# Patient Record
Sex: Female | Born: 1970 | Hispanic: No | Marital: Married | Smoking: Former smoker
Health system: Southern US, Community
[De-identification: ages and names within clinical notes are randomized; demographics above are authoritative.]

## PROBLEM LIST (undated history)

## (undated) DIAGNOSIS — N200 Calculus of kidney: Secondary | ICD-10-CM

## (undated) DIAGNOSIS — R002 Palpitations: Secondary | ICD-10-CM

## (undated) DIAGNOSIS — Z87442 Personal history of urinary calculi: Secondary | ICD-10-CM

## (undated) DIAGNOSIS — I471 Supraventricular tachycardia, unspecified: Secondary | ICD-10-CM

## (undated) DIAGNOSIS — I499 Cardiac arrhythmia, unspecified: Secondary | ICD-10-CM

---
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
## 2008-03-24 ENCOUNTER — Other Ambulatory Visit: Admission: RE | Admit: 2008-03-24 | Discharge: 2008-03-24 | Payer: Self-pay | Admitting: Internal Medicine

## 2008-11-13 ENCOUNTER — Ambulatory Visit: Payer: Self-pay | Admitting: Internal Medicine

## 2009-06-01 ENCOUNTER — Ambulatory Visit: Payer: Self-pay | Admitting: Internal Medicine

## 2010-01-10 ENCOUNTER — Ambulatory Visit: Payer: Self-pay | Admitting: Internal Medicine

## 2010-01-10 ENCOUNTER — Encounter: Admission: RE | Admit: 2010-01-10 | Discharge: 2010-01-10 | Payer: Self-pay | Admitting: Internal Medicine

## 2011-01-12 ENCOUNTER — Ambulatory Visit (HOSPITAL_COMMUNITY)
Admission: RE | Admit: 2011-01-12 | Discharge: 2011-01-12 | Disposition: A | Discharge status: Home / Self Care | Payer: BC Managed Care – PPO | Source: Ambulatory Visit | Attending: Urology | Admitting: Urology

## 2011-01-12 DIAGNOSIS — Z79899 Other long term (current) drug therapy: Secondary | ICD-10-CM | POA: Insufficient documentation

## 2011-01-12 DIAGNOSIS — F17200 Nicotine dependence, unspecified, uncomplicated: Secondary | ICD-10-CM | POA: Insufficient documentation

## 2011-01-12 DIAGNOSIS — R109 Unspecified abdominal pain: Secondary | ICD-10-CM | POA: Insufficient documentation

## 2011-01-12 DIAGNOSIS — N200 Calculus of kidney: Secondary | ICD-10-CM | POA: Insufficient documentation

## 2011-07-31 ENCOUNTER — Ambulatory Visit (HOSPITAL_COMMUNITY): Admission: RE | Admit: 2011-07-31 | Payer: BC Managed Care – PPO | Source: Ambulatory Visit | Admitting: Urology

## 2011-07-31 ENCOUNTER — Encounter (HOSPITAL_COMMUNITY): Admission: RE | Payer: Self-pay | Source: Ambulatory Visit

## 2011-07-31 SURGERY — LITHOTRIPSY, ESWL
Anesthesia: LOCAL | Laterality: Left

## 2013-05-05 ENCOUNTER — Other Ambulatory Visit: Payer: Self-pay | Admitting: Urology

## 2013-05-06 ENCOUNTER — Other Ambulatory Visit: Payer: Self-pay | Admitting: Radiology

## 2013-05-06 ENCOUNTER — Other Ambulatory Visit: Payer: Self-pay | Admitting: Urology

## 2013-05-06 DIAGNOSIS — N200 Calculus of kidney: Secondary | ICD-10-CM

## 2013-06-06 ENCOUNTER — Encounter (HOSPITAL_COMMUNITY): Payer: Self-pay | Admitting: Pharmacy Technician

## 2013-06-10 ENCOUNTER — Encounter (HOSPITAL_COMMUNITY): Payer: Self-pay | Admitting: Pharmacy Technician

## 2013-06-12 ENCOUNTER — Other Ambulatory Visit (HOSPITAL_COMMUNITY): Payer: Self-pay | Admitting: Urology

## 2013-06-12 ENCOUNTER — Other Ambulatory Visit: Payer: Self-pay | Admitting: Radiology

## 2013-06-13 ENCOUNTER — Encounter (HOSPITAL_COMMUNITY)
Admission: RE | Admit: 2013-06-13 | Discharge: 2013-06-13 | Disposition: A | Discharge status: Home / Self Care | Payer: BC Managed Care – PPO | Source: Ambulatory Visit | Attending: Urology | Admitting: Urology

## 2013-06-13 ENCOUNTER — Encounter (HOSPITAL_COMMUNITY): Payer: Self-pay

## 2013-06-13 ENCOUNTER — Other Ambulatory Visit: Payer: Self-pay

## 2013-06-13 DIAGNOSIS — Z01812 Encounter for preprocedural laboratory examination: Secondary | ICD-10-CM | POA: Insufficient documentation

## 2013-06-13 DIAGNOSIS — Z01818 Encounter for other preprocedural examination: Secondary | ICD-10-CM | POA: Insufficient documentation

## 2013-06-13 DIAGNOSIS — Z01810 Encounter for preprocedural cardiovascular examination: Secondary | ICD-10-CM | POA: Insufficient documentation

## 2013-06-13 HISTORY — DX: Palpitations: R00.2

## 2013-06-13 HISTORY — DX: Personal history of urinary calculi: Z87.442

## 2013-06-13 HISTORY — DX: Supraventricular tachycardia, unspecified: I47.10

## 2013-06-13 HISTORY — DX: Supraventricular tachycardia: I47.1

## 2013-06-13 LAB — CBC
Hemoglobin: 14.6 g/dL (ref 12.0–15.0)
MCHC: 34.2 g/dL (ref 30.0–36.0)
Platelets: 255 10*3/uL (ref 150–400)
RDW: 12.8 % (ref 11.5–15.5)
WBC: 8.3 10*3/uL (ref 4.0–10.5)

## 2013-06-13 LAB — APTT: aPTT: 27 seconds (ref 24–37)

## 2013-06-13 LAB — BASIC METABOLIC PANEL
GFR calc Af Amer: >90 mL/min (ref >90)
GFR calc non Af Amer: >90 mL/min (ref >90)
Glucose, Bld: 117 mg/dL — ABNORMAL HIGH (ref 70–99)
Potassium: 3.9 mEq/L (ref 3.5–5.1)
Sodium: 137 mEq/L (ref 135–145)

## 2013-06-13 LAB — PROTIME-INR
INR: 0.99 (ref 0.00–1.49)
Prothrombin Time: 12.9 seconds (ref 11.6–15.2)

## 2013-06-13 LAB — ABO/RH: ABO/RH(D): A POS

## 2013-06-13 LAB — HCG, SERUM, QUALITATIVE: Preg, Serum: NEGATIVE

## 2013-06-13 NOTE — Patient Instructions (Addendum)
JEANINE CAVEN  06/13/2013                           YOUR PROCEDURE IS SCHEDULED ON: 06/17/13               PLEASE REPORT TO SHORT STAY CENTER AT :  8:30 AM               CALL THIS NUMBER IF ANY PROBLEMS THE DAY OF SURGERY :               [phone]                      REMEMBER:   Do not eat food or drink liquids AFTER MIDNIGHT   Take these medicines the morning of surgery with A SIP OF WATER:  NONE   Do not wear jewelry, make-up   Do not wear lotions, powders, or perfumes.   Do not shave legs or underarms 12 hrs. before surgery (men may shave face)  Do not bring valuables to the hospital.  Contacts, dentures or bridgework may not be worn into surgery.  Leave suitcase in the car. After surgery it may be brought to your room.  For patients admitted to the hospital more than one night, checkout time is 11:00                          The day of discharge.   Patients discharged the day of surgery will not be allowed to drive home                             If going home same day of surgery, must have someone stay with you first                           24 hrs at home and arrange for some one to drive you home from hospital.    Special Instructions:   Please read over the following fact sheets that you were given:                              1. [facility] PREPARING FOR SURGERY SHEET                                                X_____________________________________________________________________        Failure to follow these instructions may result in cancellation of your surgery

## 2013-06-16 NOTE — H&P (Signed)
ctive Problems Problems  1. Microscopic Hematuria 599.72 2. 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 Pyuria 791.9 4. Renal Cyst Right 593.2  History of Present Illness   Cheyenne Riley returns today in f/u for her bilateral nephrolithiasis.  She continues to have some left flank pain that is positional.  She has had no associated hematuria, nausea of voiding complaints.   The pain can radiate to the pelvic area when is it most severe.  A KUB shows a LLP stone that is now 16.3x9.4mm and a collection of stones in the right lower pole- all stones appear stable from her previous KUB in July 2013.  She denies dysuria or gross hematuria.   Past Medical History Problems  1. History of  Migraine Headache 346.90 2. History of  Nephrolithiasis V13.01 3. History of  Ureteral Stone 592.1  Surgical History Problems  1. History of  Hysteroscopy With Endometrial Ablation 2. History of  Lithotripsy 3. History of  Tonsillectomy With Adenoidectomy  Current Meds 1. Ibuprofen TABS; Therapy: (Recorded:22Mar2011) to 2. Oxycodone-Acetaminophen 5-325 MG Oral Tablet; take 1 or 2 tablets q 4-6 hours prn pain;  Therapy: 18Jul2013 to (Last Rx:18Jul2013)  Allergies Medication  1. No Known Drug Allergies  Family History Problems  1. Maternal history of  Atherosclerosis V17.49 2. Maternal history of  Family Health Status - Father's Age age 63 72. Maternal history of  Family Health Status - Mother's Age age 48 4. Maternal history of  Family Health Status Number Of Children 2 sons 2 daughters 5. Maternal history of  Heart Disease V17.49 6. Paternal history of  Nephrolithiasis  Social History Problems  1. Alcohol Use social 2. Caffeine Use 3. Marital History - Currently Married 4. Occupation: Event organiser 5. Tobacco Use V15.82 currently patching--smoked for 15 years  Quit 6 weeks ago   Past and social history reviewed and updated.   Review of Systems Genitourinary, constitutional, skin, eye,  otolaryngeal, hematologic/lymphatic, cardiovascular, pulmonary, endocrine, musculoskeletal, gastrointestinal, neurological and psychiatric system(s) were reviewed and pertinent findings if present are noted.  Gastrointestinal: flank pain and abdominal pain.  Constitutional: recent 15 lb weight gain.    Vitals Vital Signs [Data Includes: Last 1 Day]  05Sep2014 09:19AM  BMI Calculated: 35.88 BSA Calculated: 1.92 Weight: 200 lb  Blood Pressure: 125 / 83 Heart Rate: 65  Physical Exam Constitutional: Well nourished and well developed . No acute distress.  Pulmonary: No respiratory distress and normal respiratory rhythm and effort.  Cardiovascular: Heart rate and rhythm are normal . No peripheral edema.    Results/Data Urine [Data Includes: Last 1 Day]   05Sep2014  COLOR YELLOW   APPEARANCE CLEAR   SPECIFIC GRAVITY 1.010   pH 6.5   GLUCOSE NEG mg/dL  BILIRUBIN NEG   KETONE NEG mg/dL  BLOOD LARGE   PROTEIN NEG mg/dL  UROBILINOGEN 0.2 mg/dL  NITRITE NEG   LEUKOCYTE ESTERASE SMALL   SQUAMOUS EPITHELIAL/HPF RARE   WBC 7-10 WBC/hpf  RBC 3-6 RBC/hpf  BACTERIA RARE   CRYSTALS NONE SEEN   CASTS NONE SEEN    The following images/tracing/specimen were independently visualized:  KUB today shows stable small stones in the RLP but the LLP stone has grown from 16 to 22mm and is more central. There are no other abnormalities.  The following clinical lab reports were reviewed:  UA reviewed.    Assessment Assessed  1. 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 Pyuria 791.9 3. Microscopic Hematuria 599.72   Her stone has grown and still has pain. Her  UA has pyuria and hematuria that is probably from the stones.   Plan  Health Maintenance (V70.0)  1. UA With REFLEX  Done: 05Sep2014 08:55AM Nephrolithiasis Of Both Kidneys (592.0)  2. Oxycodone-Acetaminophen 5-325 MG Oral Tablet; take 1 or 2 tablets q 4-6 hours prn pain;  Therapy: 18Jul2013 to (Last Rx:05Sep2014) 3. Potassium Citrate ER  10 MEQ (1080 MG) Oral Tablet Extended Release; TAKE 1 TABLET TID;  Therapy: 05Sep2014 to (Last Rx:05Sep2014) 4. Follow-up Schedule Surgery Office  Follow-up  Done: 05Sep2014   I will culture the urine today. Potassium citrate and oxycodone refilled. I discussed treatment of her metabolically active stone which include ESWL or PCNL.   With her stone size she would likely require multiple ESWL's and stenting and I think PCNL is the best option.   I revgiewed the risks of bleeding, infection, renal or adjacent organ injury, need for multiple procedures, thrombotic events, urine leak and anesthetic risks.  We will get her set up in the near future

## 2013-06-17 ENCOUNTER — Ambulatory Visit (HOSPITAL_COMMUNITY): Payer: BC Managed Care – PPO

## 2013-06-17 ENCOUNTER — Ambulatory Visit (HOSPITAL_COMMUNITY)
Admission: RE | Admit: 2013-06-17 | Discharge: 2013-06-17 | Disposition: A | Discharge status: Home / Self Care | Payer: BC Managed Care – PPO | Source: Ambulatory Visit | Attending: Urology | Admitting: Urology

## 2013-06-17 ENCOUNTER — Encounter (HOSPITAL_COMMUNITY)
Admission: RE | Disposition: A | Discharge status: Home / Self Care | Payer: Self-pay | Source: Ambulatory Visit | Attending: Urology

## 2013-06-17 ENCOUNTER — Encounter (HOSPITAL_COMMUNITY): Payer: Self-pay | Admitting: Radiology

## 2013-06-17 ENCOUNTER — Observation Stay (HOSPITAL_COMMUNITY)
Admission: RE | Admit: 2013-06-17 | Discharge: 2013-06-18 | Disposition: A | Discharge status: Home / Self Care | Payer: BC Managed Care – PPO | Source: Ambulatory Visit | Attending: Urology | Admitting: Urology

## 2013-06-17 ENCOUNTER — Encounter (HOSPITAL_COMMUNITY): Payer: BC Managed Care – PPO | Admitting: Anesthesiology

## 2013-06-17 ENCOUNTER — Encounter (HOSPITAL_COMMUNITY): Payer: Self-pay | Admitting: Urology

## 2013-06-17 ENCOUNTER — Ambulatory Visit (HOSPITAL_COMMUNITY): Payer: BC Managed Care – PPO | Admitting: Anesthesiology

## 2013-06-17 ENCOUNTER — Encounter (HOSPITAL_COMMUNITY): Payer: Self-pay

## 2013-06-17 VITALS — BP 139/79 | HR 75 | Temp 98.3°F | Resp 10 | Ht 62.75 in | Wt 204.0 lb

## 2013-06-17 DIAGNOSIS — N281 Cyst of kidney, acquired: Secondary | ICD-10-CM | POA: Insufficient documentation

## 2013-06-17 DIAGNOSIS — R82998 Other abnormal findings in urine: Secondary | ICD-10-CM | POA: Insufficient documentation

## 2013-06-17 DIAGNOSIS — N200 Calculus of kidney: Secondary | ICD-10-CM

## 2013-06-17 DIAGNOSIS — R3129 Other microscopic hematuria: Secondary | ICD-10-CM | POA: Insufficient documentation

## 2013-06-17 DIAGNOSIS — Z87891 Personal history of nicotine dependence: Secondary | ICD-10-CM | POA: Insufficient documentation

## 2013-06-17 HISTORY — DX: Calculus of kidney: N20.0

## 2013-06-17 HISTORY — PX: NEPHROLITHOTOMY: SHX5134

## 2013-06-17 LAB — TYPE AND SCREEN
ABO/RH(D): A POS
Antibody Screen: NEGATIVE

## 2013-06-17 SURGERY — NEPHROLITHOTOMY PERCUTANEOUS
Anesthesia: General | Site: Back | Laterality: Left | Wound class: Clean Contaminated

## 2013-06-17 MED ORDER — HYDROMORPHONE HCL PF 1 MG/ML IJ SOLN
0.5000 mg | INTRAMUSCULAR | Status: DC | PRN
  Administered 2013-06-17 – 2013-06-18 (×3): 1 mg via INTRAVENOUS
  Filled 2013-06-17 (×3): qty 1

## 2013-06-17 MED ORDER — CIPROFLOXACIN IN D5W 400 MG/200ML IV SOLN
400.0000 mg | INTRAVENOUS | Status: AC
  Administered 2013-06-17: 400 mg via INTRAVENOUS

## 2013-06-17 MED ORDER — DIPHENHYDRAMINE HCL 50 MG/ML IJ SOLN: 12.5000 mg | Freq: Four times a day (QID) | INTRAMUSCULAR | Status: DC | PRN

## 2013-06-17 MED ORDER — HYDROMORPHONE HCL PF 1 MG/ML IJ SOLN
0.2500 mg | INTRAMUSCULAR | Status: DC | PRN
  Administered 2013-06-17 (×2): 0.5 mg via INTRAVENOUS

## 2013-06-17 MED ORDER — IOHEXOL 300 MG/ML  SOLN
INTRAMUSCULAR | Status: DC | PRN
  Administered 2013-06-17: 15 mL via INTRAVENOUS

## 2013-06-17 MED ORDER — HYDROMORPHONE HCL PF 1 MG/ML IJ SOLN
INTRAMUSCULAR | Status: DC | PRN
  Administered 2013-06-17 (×2): 0.5 mg via INTRAVENOUS
  Administered 2013-06-17 (×2): .5 mg via INTRAVENOUS

## 2013-06-17 MED ORDER — ONDANSETRON HCL 4 MG/2ML IJ SOLN
INTRAMUSCULAR | Status: DC | PRN
  Administered 2013-06-17: 4 mg via INTRAVENOUS

## 2013-06-17 MED ORDER — LIDOCAINE HCL (CARDIAC) 20 MG/ML IV SOLN
INTRAVENOUS | Status: DC | PRN
  Administered 2013-06-17: 100 mg via INTRAVENOUS

## 2013-06-17 MED ORDER — FENTANYL CITRATE 0.05 MG/ML IJ SOLN
INTRAMUSCULAR | Status: AC
  Filled 2013-06-17: qty 6

## 2013-06-17 MED ORDER — IOHEXOL 300 MG/ML  SOLN
25.0000 mL | Freq: Once | INTRAMUSCULAR | Status: AC | PRN
  Administered 2013-06-17: 1 mL

## 2013-06-17 MED ORDER — FENTANYL CITRATE 0.05 MG/ML IJ SOLN
INTRAMUSCULAR | Status: AC
  Filled 2013-06-17: qty 2

## 2013-06-17 MED ORDER — MIDAZOLAM HCL 2 MG/2ML IJ SOLN
INTRAMUSCULAR | Status: AC | PRN
  Administered 2013-06-17 (×4): 1 mg via INTRAVENOUS

## 2013-06-17 MED ORDER — LACTATED RINGERS IV SOLN
INTRAVENOUS | Status: DC
  Administered 2013-06-17: 14:00:00 via INTRAVENOUS

## 2013-06-17 MED ORDER — DEXAMETHASONE SODIUM PHOSPHATE 10 MG/ML IJ SOLN
INTRAMUSCULAR | Status: DC | PRN
  Administered 2013-06-17: 10 mg via INTRAVENOUS

## 2013-06-17 MED ORDER — SODIUM CHLORIDE 0.9 % IR SOLN
Status: DC | PRN
  Administered 2013-06-17: 6000 mL via INTRAVESICAL

## 2013-06-17 MED ORDER — MIDAZOLAM HCL 5 MG/5ML IJ SOLN
INTRAMUSCULAR | Status: DC | PRN
  Administered 2013-06-17: 2 mg via INTRAVENOUS

## 2013-06-17 MED ORDER — FENTANYL CITRATE 0.05 MG/ML IJ SOLN
25.0000 ug | INTRAMUSCULAR | Status: DC | PRN
  Administered 2013-06-17 (×3): 50 ug via INTRAVENOUS

## 2013-06-17 MED ORDER — LIDOCAINE-EPINEPHRINE (PF) 2 %-1:200000 IJ SOLN
INTRAMUSCULAR | Status: AC
  Filled 2013-06-17: qty 20

## 2013-06-17 MED ORDER — BISACODYL 10 MG RE SUPP: 10.0000 mg | Freq: Every day | RECTAL | Status: DC | PRN

## 2013-06-17 MED ORDER — NEOSTIGMINE METHYLSULFATE 1 MG/ML IJ SOLN
INTRAMUSCULAR | Status: DC | PRN
  Administered 2013-06-17: 4 mg via INTRAVENOUS

## 2013-06-17 MED ORDER — FENTANYL CITRATE 0.05 MG/ML IJ SOLN
INTRAMUSCULAR | Status: DC | PRN
  Administered 2013-06-17: 100 ug via INTRAVENOUS
  Administered 2013-06-17 (×3): 50 ug via INTRAVENOUS

## 2013-06-17 MED ORDER — KCL IN DEXTROSE-NACL 20-5-0.45 MEQ/L-%-% IV SOLN
INTRAVENOUS | Status: DC
  Administered 2013-06-17 – 2013-06-18 (×4): via INTRAVENOUS
  Filled 2013-06-17 (×4): qty 1000

## 2013-06-17 MED ORDER — CIPROFLOXACIN IN D5W 400 MG/200ML IV SOLN
INTRAVENOUS | Status: AC
  Filled 2013-06-17: qty 200

## 2013-06-17 MED ORDER — ACETAMINOPHEN 325 MG PO TABS: 650.0000 mg | ORAL_TABLET | ORAL | Status: DC | PRN

## 2013-06-17 MED ORDER — CIPROFLOXACIN HCL 500 MG PO TABS
500.0000 mg | ORAL_TABLET | Freq: Two times a day (BID) | ORAL | Status: DC
  Administered 2013-06-17 – 2013-06-18 (×2): 500 mg via ORAL
  Filled 2013-06-17 (×4): qty 1

## 2013-06-17 MED ORDER — CIPROFLOXACIN IN D5W 400 MG/200ML IV SOLN: 400.0000 mg | INTRAVENOUS | Status: DC

## 2013-06-17 MED ORDER — OXYCODONE-ACETAMINOPHEN 5-325 MG PO TABS: 1.0000 | ORAL_TABLET | ORAL | Status: DC | PRN

## 2013-06-17 MED ORDER — PROPOFOL 10 MG/ML IV BOLUS
INTRAVENOUS | Status: DC | PRN
  Administered 2013-06-17: 180 mg via INTRAVENOUS

## 2013-06-17 MED ORDER — ROCURONIUM BROMIDE 100 MG/10ML IV SOLN
INTRAVENOUS | Status: DC | PRN
  Administered 2013-06-17: 40 mg via INTRAVENOUS

## 2013-06-17 MED ORDER — MIDAZOLAM HCL 2 MG/2ML IJ SOLN
INTRAMUSCULAR | Status: AC
  Filled 2013-06-17: qty 6

## 2013-06-17 MED ORDER — SODIUM CHLORIDE 0.9 % IV SOLN
INTRAVENOUS | Status: DC
  Administered 2013-06-17: 08:00:00 via INTRAVENOUS

## 2013-06-17 MED ORDER — DIPHENHYDRAMINE HCL 12.5 MG/5ML PO ELIX: 12.5000 mg | ORAL_SOLUTION | Freq: Four times a day (QID) | ORAL | Status: DC | PRN

## 2013-06-17 MED ORDER — LACTATED RINGERS IV SOLN
INTRAVENOUS | Status: DC | PRN
  Administered 2013-06-17: 12:00:00 via INTRAVENOUS

## 2013-06-17 MED ORDER — ONDANSETRON HCL 4 MG/2ML IJ SOLN
4.0000 mg | INTRAMUSCULAR | Status: DC | PRN
  Administered 2013-06-17 – 2013-06-18 (×2): 4 mg via INTRAVENOUS
  Filled 2013-06-17 (×2): qty 2

## 2013-06-17 MED ORDER — PROMETHAZINE HCL 25 MG/ML IJ SOLN: 6.2500 mg | INTRAMUSCULAR | Status: DC | PRN

## 2013-06-17 MED ORDER — ZOLPIDEM TARTRATE 5 MG PO TABS: 5.0000 mg | ORAL_TABLET | Freq: Every evening | ORAL | Status: DC | PRN

## 2013-06-17 MED ORDER — FENTANYL CITRATE 0.05 MG/ML IJ SOLN
INTRAMUSCULAR | Status: AC | PRN
  Administered 2013-06-17: 100 ug via INTRAVENOUS
  Administered 2013-06-17 (×2): 50 ug via INTRAVENOUS

## 2013-06-17 MED ORDER — GLYCOPYRROLATE 0.2 MG/ML IJ SOLN
INTRAMUSCULAR | Status: DC | PRN
  Administered 2013-06-17: 0.6 mg via INTRAVENOUS

## 2013-06-17 MED ORDER — HYOSCYAMINE SULFATE 0.125 MG SL SUBL
0.1250 mg | SUBLINGUAL_TABLET | SUBLINGUAL | Status: DC | PRN
  Filled 2013-06-17: qty 1

## 2013-06-17 MED ORDER — CIPROFLOXACIN HCL 500 MG PO TABS: 500.0000 mg | ORAL_TABLET | Freq: Two times a day (BID) | ORAL | Status: DC

## 2013-06-17 MED ORDER — HYDROMORPHONE HCL PF 1 MG/ML IJ SOLN
INTRAMUSCULAR | Status: AC
  Filled 2013-06-17: qty 1

## 2013-06-17 MED ORDER — OXYCODONE HCL 5 MG PO TABS
5.0000 mg | ORAL_TABLET | ORAL | Status: DC | PRN
  Administered 2013-06-17 – 2013-06-18 (×4): 5 mg via ORAL
  Filled 2013-06-17 (×4): qty 1

## 2013-06-17 MED ORDER — DOCUSATE SODIUM 100 MG PO CAPS
100.0000 mg | ORAL_CAPSULE | Freq: Two times a day (BID) | ORAL | Status: DC
  Administered 2013-06-17 – 2013-06-18 (×2): 100 mg via ORAL
  Filled 2013-06-17 (×3): qty 1

## 2013-06-17 SURGICAL SUPPLY — 48 items
BAG URINE DRAINAGE (UROLOGICAL SUPPLIES) ×3 IMPLANT
BAG URO CATCHER STRL LF (DRAPE) IMPLANT
BASKET ZERO TIP NITINOL 2.4FR (BASKET) ×3 IMPLANT
BENZOIN TINCTURE PRP APPL 2/3 (GAUZE/BANDAGES/DRESSINGS) ×3 IMPLANT
BLADE SURG 15 STRL LF DISP TIS (BLADE) ×2 IMPLANT
BLADE SURG 15 STRL SS (BLADE) ×1
CARTRIDGE STONEBREAK CO2 KIDNE (ELECTROSURGICAL) ×3 IMPLANT
CATH AINSWORTH 30CC 24FR (CATHETERS) ×3 IMPLANT
CATH BEACON 5.038 65CM KMP-01 (CATHETERS) ×3 IMPLANT
CATH FOLEY 2W COUNCIL 20FR 5CC (CATHETERS) IMPLANT
CATH ROBINSON RED A/P 20FR (CATHETERS) IMPLANT
CATH URET 5FR 28IN OPEN ENDED (CATHETERS) IMPLANT
CATH X-FORCE N30 NEPHROSTOMY (TUBING) ×3 IMPLANT
CLOTH BEACON ORANGE TIMEOUT ST (SAFETY) ×3 IMPLANT
COVER SURGICAL LIGHT HANDLE (MISCELLANEOUS) ×3 IMPLANT
DRAPE C-ARM 42X120 X-RAY (DRAPES) ×3 IMPLANT
DRAPE CAMERA CLOSED 9X96 (DRAPES) ×3 IMPLANT
DRAPE LINGEMAN PERC (DRAPES) ×3 IMPLANT
DRAPE SURG IRRIG POUCH 19X23 (DRAPES) ×3 IMPLANT
DRSG PAD ABDOMINAL 8X10 ST (GAUZE/BANDAGES/DRESSINGS) ×3 IMPLANT
DRSG TEGADERM 8X12 (GAUZE/BANDAGES/DRESSINGS) ×3 IMPLANT
FIBER LASER FLEXIVA 550 (UROLOGICAL SUPPLIES) IMPLANT
GLOVE SURG SS PI 8.0 STRL IVOR (GLOVE) IMPLANT
GOWN STRL REIN XL XLG (GOWN DISPOSABLE) ×3 IMPLANT
GUIDEWIRE AMPLAZ .035X145 (WIRE) ×6 IMPLANT
KIT BASIN OR (CUSTOM PROCEDURE TRAY) ×3 IMPLANT
LASER FIBER DISP 1000U (UROLOGICAL SUPPLIES) IMPLANT
MANIFOLD NEPTUNE II (INSTRUMENTS) ×3 IMPLANT
NS IRRIG 1000ML POUR BTL (IV SOLUTION) ×3 IMPLANT
PACK BASIC VI WITH GOWN DISP (CUSTOM PROCEDURE TRAY) ×3 IMPLANT
PACK CYSTO (CUSTOM PROCEDURE TRAY) ×3 IMPLANT
PAD ABD 7.5X8 STRL (GAUZE/BANDAGES/DRESSINGS) ×6 IMPLANT
PROBE KIDNEY STONEBRKR 2.0X425 (ELECTROSURGICAL) ×3 IMPLANT
PROBE LITHOCLAST ULTRA 3.8X403 (UROLOGICAL SUPPLIES) IMPLANT
PROBE PNEUMATIC 1.0MMX570MM (UROLOGICAL SUPPLIES) IMPLANT
SET IRRIG Y TYPE TUR BLADDER L (SET/KITS/TRAYS/PACK) ×3 IMPLANT
SET WARMING FLUID IRRIGATION (MISCELLANEOUS) IMPLANT
SHEATH PEELAWAY SET 9 (SHEATH) ×3 IMPLANT
SPONGE GAUZE 4X4 12PLY (GAUZE/BANDAGES/DRESSINGS) ×3 IMPLANT
SPONGE LAP 4X18 X RAY DECT (DISPOSABLE) ×3 IMPLANT
STONE CATCHER W/TUBE ADAPTER (UROLOGICAL SUPPLIES) ×3 IMPLANT
SUT SILK 2 0 30  PSL (SUTURE) ×1
SUT SILK 2 0 30 PSL (SUTURE) ×2 IMPLANT
SYR 20CC LL (SYRINGE) ×6 IMPLANT
SYRINGE 10CC LL (SYRINGE) ×3 IMPLANT
TOWEL OR NON WOVEN STRL DISP B (DISPOSABLE) ×3 IMPLANT
TRAY FOLEY CATH 14FRSI W/METER (CATHETERS) ×3 IMPLANT
TUBING CONNECTING 10 (TUBING) ×9 IMPLANT

## 2013-06-17 NOTE — Procedures (Signed)
Successful placement of left sided 5 Fr catheter for PNL access.  Tip of catheter is within the urinary bladder.  No immediate complications.    

## 2013-06-17 NOTE — H&P (Signed)
Cheyenne Riley is an 42 y.o. female.   Chief Complaint: kidney stones HPI: Patient with history of nephrolithiasis presents today for left percutaneous nephrostomy prior to nephrolithotomy.  Past Medical History  Diagnosis Date  . Palpitation   . SVT (supraventricular tachycardia)   . History of kidney stones   . Renal stone 06/17/2013    22mm left renal stone that has grown.  She also has small right renal stones.     Past Surgical History  Procedure Laterality Date  . Tonsillectomy  1978    History reviewed. No pertinent family history. Social History:  reports that she has been smoking.  She does not have any smokeless tobacco history on file. She reports that she does not drink alcohol or use illicit drugs.  Allergies:  Allergies  Allergen Reactions  . Banana Itching and Swelling    Current facility-administered medications:0.9 %  sodium chloride infusion, , Intravenous, Continuous, D Kevin Allred, PA-C, Last Rate: 20 mL/hr at 06/17/13 0820;  ciprofloxacin (CIPRO) IVPB 400 mg, 400 mg, Intravenous, 60 min Pre-Op, Bjorn Pippin, MD No current outpatient prescriptions on file.  Results for orders placed during the hospital encounter of 06/13/13  HCG, SERUM, QUALITATIVE      Result Value Range   Preg, Serum NEGATIVE  NEGATIVE  CBC      Result Value Range   WBC 8.3  4.0 - 10.5 K/uL   RBC 4.83  3.87 - 5.11 MIL/uL   Hemoglobin 14.6  12.0 - 15.0 g/dL   HCT 09.8  11.9 - 14.7 %   MCV 88.4  78.0 - 100.0 fL   MCH 30.2  26.0 - 34.0 pg   MCHC 34.2  30.0 - 36.0 g/dL   RDW 82.9  56.2 - 13.0 %   Platelets 255  150 - 400 K/uL  BASIC METABOLIC PANEL      Result Value Range   Sodium 137  135 - 145 mEq/L   Potassium 3.9  3.5 - 5.1 mEq/L   Chloride 104  96 - 112 mEq/L   CO2 24  19 - 32 mEq/L   Glucose, Bld 117 (*) 70 - 99 mg/dL   BUN 12  6 - 23 mg/dL   Creatinine, Ser 8.65  0.50 - 1.10 mg/dL   Calcium 9.1  8.4 - 78.4 mg/dL   GFR calc non Af Amer >90  >90 mL/min   GFR calc Af Amer  >90  >90 mL/min  PROTIME-INR      Result Value Range   Prothrombin Time 12.9  11.6 - 15.2 seconds   INR 0.99  0.00 - 1.49  APTT      Result Value Range   aPTT 27  24 - 37 seconds  TYPE AND SCREEN      Result Value Range   ABO/RH(D) A POS     Antibody Screen NEG     Sample Expiration 06/20/2013    ABO/RH      Result Value Range   ABO/RH(D) A POS      Review of Systems  Constitutional: Negative for fever and chills.  Respiratory: Negative for shortness of breath.        Occ cough  Cardiovascular: Negative for chest pain.  Gastrointestinal: Positive for abdominal pain. Negative for nausea and vomiting.  Genitourinary: Positive for flank pain. Negative for dysuria and hematuria.  Neurological: Negative for headaches.  Endo/Heme/Allergies: Does not bruise/bleed easily.    Blood pressure 125/87, pulse 71, temperature 98.3 F (36.8 C), temperature  source Oral, resp. rate 18, height 5' 2.75" (1.594 m), weight 204 lb (92.534 kg), last menstrual period 05/25/2013, SpO2 99.00%. Physical Exam  Constitutional: She is oriented to person, place, and time. She appears well-developed and well-nourished.  Cardiovascular: Normal rate and regular rhythm.   Respiratory: Effort normal and breath sounds normal.  GI: Soft. Bowel sounds are normal.  Mild pelvic tenderness  Musculoskeletal: Normal range of motion. She exhibits no edema.  Neurological: She is alert and oriented to person, place, and time.     Assessment/Plan Pt with hx of nephrolithiasis. Plan is for left percutaneous nephrostomy today prior to nephrolithotomy. Details/risks of procedure d/w pt with her understanding and consent.   ALLRED,D KEVIN 06/17/2013, 8:46 AM

## 2013-06-17 NOTE — Transfer of Care (Addendum)
Immediate Anesthesia Transfer of Care Note  Patient: Cheyenne Riley  Procedure(s) Performed: Procedure(s): NEPHROLITHOTOMY PERCUTANEOUS (Left)  Patient Location: PACU  Anesthesia Type:General  Level of Consciousness: awake, alert  and oriented  Airway & Oxygen Therapy: Patient connected to face mask oxygen, spontaneously breathing  Post-op Assessment: Report given to PACU RN and Post -op Vital signs reviewed and stable  Post vital signs: Reviewed and stable  Complications: No apparent anesthesia complications

## 2013-06-17 NOTE — Interval H&P Note (Signed)
History and Physical Interval Note:  06/17/2013 10:54 AM  Cheyenne Riley  has presented today for surgery, with the diagnosis of LEFT RENAL STONE  The various methods of treatment have been discussed with the patient and family. After consideration of risks, benefits and other options for treatment, the patient has consented to  Procedure(s): NEPHROLITHOTOMY PERCUTANEOUS (Left) HOLMIUM LASER APPLICATION (Left) as a surgical intervention .  The patient's history has been reviewed, patient examined, no change in status, stable for surgery.  I have reviewed the patient's chart and labs.  Questions were answered to the patient's satisfaction.     Giannis Corpuz J

## 2013-06-17 NOTE — Progress Notes (Signed)
[facility] BOST [id number]  Code Status: Full Admission Data: 06/17/2013 2:51 PM  Attending Provider: Annabell Howells ION:GEXBMW, JILL, NP  Consults/ Treatment Team:    Cheyenne Riley is a 42 y.o. female patient admitted from ED awake, alert - oriented X 3 - no acute distress noted. VSS - Blood pressure 152/79, pulse 60, temperature 97.9 F (36.6 C), resp. rate 12, last menstrual period 05/25/2013, SpO2 96.00%. no c/o shortness of breath, no c/o chest pain. O2: @ 2l/min per nasal cannula. IV Fluids: IV in place, occlusive dsg intact without redness, IV cath forearm right, condition patent and no redness  lactated Ringer's.  Allergies:  Allergies  Allergen Reactions  . Banana Itching and Swelling      Past Medical History  Diagnosis Date  . Palpitation   . SVT (supraventricular tachycardia)   . History of kidney stones   . Renal stone 06/17/2013    22mm left renal stone that has grown.  She also has small right renal stones.     Medications Prior to Admission  Medication Sig Dispense Refill  . ibuprofen (ADVIL,MOTRIN) 200 MG tablet Take 200 mg by mouth every 6 (six) hours as needed for pain.      . potassium citrate (UROCIT-K) 10 MEQ (1080 MG) SR tablet Take 10 mEq by mouth 3 (three) times daily with meals.      . [DISCONTINUED] oxyCODONE-acetaminophen (PERCOCET/ROXICET) 5-325 MG per tablet Take 1 tablet by mouth every 4 (four) hours as needed for pain.         Orientation to room, and floor completed with information packet given to patient/family. Patient declined safety video at this time. Admission INP armband ID verified with patient/family, and in place.  SR up x 2, fall assessment complete, with patient able to verbalize understanding of risk associated with falls, and verbalized understanding to call nsg before up out of bed. Call light within reach, patient able to voice, and demonstrate understanding. Skin, clean-dry- intact without evidence of bruising, or skin tears.  No evidence  of skin break down noted on exam.  ?  Will cont to eval and treat per MD orders.  Al Decant, [place]  06/17/2013 2:51 PM

## 2013-06-17 NOTE — Brief Op Note (Signed)
06/17/2013  12:36 PM  PATIENT:  Cheyenne Riley  42 y.o. female  PRE-OPERATIVE DIAGNOSIS:  LEFT RENAL STONE 22mm   POST-OPERATIVE DIAGNOSIS:  LEFT RENAL STONE 22mm  PROCEDURE:  Procedure(s): NEPHROLITHOTOMY PERCUTANEOUS >2cm(Left)  SURGEON:  Surgeon(s) and Role:    * Bjorn Pippin, MD - Primary  PHYSICIAN ASSISTANT:   ASSISTANTS: none   ANESTHESIA:   general  EBL:     BLOOD ADMINISTERED:none  DRAINS: Urinary Catheter (Foley) and 24 ainsworth nephrostomy and 81fr Kompy cath   LOCAL MEDICATIONS USED:  NONE  SPECIMEN:  Source of Specimen:  stone fragments  DISPOSITION OF SPECIMEN:  to family to bring to office  COUNTS:  YES  TOURNIQUET:  * No tourniquets in log *  DICTATION: .Other Dictation: Dictation Number [id number]  PLAN OF CARE: Admit for overnight observation  PATIENT DISPOSITION:  PACU - hemodynamically stable.   Delay start of Pharmacological VTE agent (>24hrs) due to surgical blood loss or risk of bleeding: yes

## 2013-06-17 NOTE — Anesthesia Preprocedure Evaluation (Signed)
Anesthesia Evaluation  Patient identified by MRN, date of birth, ID band Patient awake    Reviewed: Allergy & Precautions, H&P , NPO status , Patient's Chart, lab work & pertinent test results  Airway Mallampati: II TM Distance: >3 FB     Dental  (+) Teeth Intact and Dental Advisory Given   Pulmonary neg pulmonary ROS,  breath sounds clear to auscultation  Pulmonary exam normal       Cardiovascular negative cardio ROS  + dysrhythmias Supra Ventricular Tachycardia Rhythm:Regular Rate:Normal     Neuro/Psych negative neurological ROS  negative psych ROS   GI/Hepatic negative GI ROS, Neg liver ROS,   Endo/Other  negative endocrine ROS  Renal/GU negative Renal ROS  negative genitourinary   Musculoskeletal negative musculoskeletal ROS (+)   Abdominal   Peds negative pediatric ROS (+)  Hematology negative hematology ROS (+)   Anesthesia Other Findings   Reproductive/Obstetrics negative OB ROS                           Anesthesia Physical Anesthesia Plan  ASA: II  Anesthesia Plan: General   Post-op Pain Management:    Induction: Intravenous  Airway Management Planned: Oral ETT  Additional Equipment:   Intra-op Plan:   Post-operative Plan: Extubation in OR  Informed Consent: I have reviewed the patients History and Physical, chart, labs and discussed the procedure including the risks, benefits and alternatives for the proposed anesthesia with the patient or authorized representative who has indicated his/her understanding and acceptance.   Dental advisory given  Plan Discussed with: CRNA  Anesthesia Plan Comments:         Anesthesia Quick Evaluation

## 2013-06-17 NOTE — Anesthesia Postprocedure Evaluation (Signed)
Anesthesia Post Note  Patient: Cheyenne Riley  Procedure(s) Performed: Procedure(s) (LRB): NEPHROLITHOTOMY PERCUTANEOUS (Left)  Anesthesia type: General  Patient location: PACU  Post pain: Pain level controlled  Post assessment: Post-op Vital signs reviewed  Last Vitals:  Filed Vitals:   06/17/13 1418  BP: 152/79  Pulse: 60  Temp: 36.6 C  Resp: 12    Post vital signs: Reviewed  Level of consciousness: sedated  Complications: No apparent anesthesia complications

## 2013-06-18 ENCOUNTER — Observation Stay (HOSPITAL_COMMUNITY): Payer: BC Managed Care – PPO

## 2013-06-18 ENCOUNTER — Encounter (HOSPITAL_COMMUNITY): Payer: Self-pay | Admitting: Urology

## 2013-06-18 MED ORDER — OXYCODONE-ACETAMINOPHEN 5-325 MG PO TABS: 1.0000 | ORAL_TABLET | ORAL | Status: DC | PRN

## 2013-06-18 NOTE — Discharge Summary (Signed)
Cheyenne Riley to be D/C'd Home per MD order. Discussed with the patient and all questions fully answered.    Medication List         ciprofloxacin 500 MG tablet  Commonly known as:  CIPRO  Take 1 tablet (500 mg total) by mouth 2 (two) times daily.     ibuprofen 200 MG tablet  Commonly known as:  ADVIL,MOTRIN  Take 200 mg by mouth every 6 (six) hours as needed for pain.     oxyCODONE-acetaminophen 5-325 MG per tablet  Commonly known as:  PERCOCET/ROXICET  Take 1 tablet by mouth every 4 (four) hours as needed for pain.     potassium citrate 10 MEQ (1080 MG) SR tablet  Commonly known as:  UROCIT-K  Take 10 mEq by mouth 3 (three) times daily with meals.        IV catheter discontinued intact. Site without signs and symptoms of complications. Dressing and pressure applied.  An After Visit Summary was printed and given to the patient.  Patient D/C home via private auto.  Beckey Downing F  06/18/2013 6:37 PM

## 2013-06-18 NOTE — Op Note (Deleted)
NAME:  Cheyenne Riley, Cheyenne Riley              ACCOUNT NO.:  [id number]  MEDICAL RECORD NO.:  [id number]  LOCATION:  [facility]                          FACILITY:  [facility]  PHYSICIAN:  Candice Lunney J. Rochell Mabie, M.D.    DATE OF BIRTH:  03/17/1971  DATE OF PROCEDURE:  06/17/2013 DATE OF DISCHARGE:  06/18/2013                              OPERATIVE REPORT   PROCEDURE:  Left percutaneous nephrolithotomy for greater than 2 cm stone.  PREOPERATIVE DIAGNOSIS:  A 22-mm left renal pelvic stone.  POSTOPERATIVE DIAGNOSIS:  A 22-mm left renal pelvic stone.  SURGEON:  Raivyn Kabler J. Neel Buffone, MD  ANESTHESIA:  General.  SPECIMEN:  Stone fragments.  BLOOD LOSS:  50 mL.  DRAINS:  A 24-French Ainsworth nephrostomy tube, 6-French Kumpe safety catheter, and 18-French Foley catheter.  COMPLICATIONS:  None.  INDICATIONS:  Cheyenne Riley is a 42-year-old white female with history of urolithiasis who has metabolically active stone in her left renal pelvis that had grown from approximately 16 mm to 22 mm over the past year on KUB based measurements from the office.  After discussing the options, we elected to proceed with percutaneous nephrolithotomy because of the size of the stone.  FINDINGS OF THE PROCEDURE:  She was given Cipro and taken to Radiology earlier today where a left lower pole access was placed without difficulty.  A CT scan was done to aid placement, this revealed a stone in the left renal pelvis.  She also had some punctate stones throughout the calices with some nephrocalcinosis and a 3.8 mm stone in the left lower pole.  Once access had been created, she was taken to the operating room where general anesthetic was induced on the holding room stretcher.  A Foley catheter was placed using sterile technique.  She was then rolled prone on chest rolls with great care being taken to pad all pressure points. She was fitted with PAS hose.  Her nephrostomy site was then prepped with Betadine solution and she was draped in the  usual sterile fashion with Lingeman drapes.  The previously placed access catheter was then wired with an Amplatz wire, which was placed to the bladder.  The catheter was removed over the wire and the nephrostomy site incision was lengthened to approximately 2 cm.  A 10-French peel-away sheath was then placed over the working wire into the proximal ureter.  The inner core was removed and a second wire was placed to the bladder.  The peel-away sheath was then removed and the track master nephrostomy balloon was then passed over the working wire until the tip was at the level of the stone on fluoroscopic imaging.  The balloon was then inflated to 18 atmospheres without difficulty and the 32-French sheath was passed over the balloon. The balloon was then removed leaving the working wire in place.  Rigid nephroscope with a 24-French sheath was then passed through the nephrostomy sheath and the stone was readily identified at the renal pelvis, it was felt to be too large to remove intact so the Cook CO2 lithotrite was then used to fragment the stone into manageable fragments.  These fragments were then removed with a two-prong grasper.  Once all the fragments had   been removed from the central stone, fluoroscopic imaging suggested another stone in the lower pole. Multiple attempts were made to access this location including the use of an antegrade nephrostogram with the flexible and rigid scopes.  I thought I had washed the fragment into the renal pelvis but was never able to identify it and it is possible it could have been in parallel calix that was not accessible from higher our access tract, however, the fragment was small and could be submucosal as well.  Once all efforts have been made to remove any identifiable fragments the nephroscope was removed and a 24-French Ainsworth catheter was passed over the working wire into the renal pelvis.  The working wire was removed and the sheath  was backed out.  The Ainsworth balloon was filled with 3 mL of sterile water.  Contrast was instilled which revealed good placement and no extravasation.  A 6-French short Kumpe catheter was then passed over the safety wire into the distal ureter and that wire was removed as well.  The nephrostomy tube and Kumpe catheter was secured to the skin with a 0 silk suture.  The drapes were removed.  The wound was cleansed and a dressing of 4x4s and Tegaderm was applied.  The nephrostomy tube had been placed to straight drainage.  The Kumpe catheter had been capped.  She was then rolled back supine on the recovery room stretcher.  Anesthetic was reversed.  She was taken to recovery room in stable condition.  There were no complications.  The stone fragments will be given to the family to bring to our office for analysis.     Tanyla Stege J. Mame Twombly, M.D.     JJW/MEDQ  D:  06/17/2013  T:  06/18/2013  Job:  [id number] 

## 2013-06-18 NOTE — Discharge Summary (Addendum)
Physician Discharge Summary  Patient ID: Cheyenne Riley MRN: [id number] DOB/AGE: Jul 23, 1971 42 y.o.  Admit date: 06/17/2013 Discharge date: 06/18/2013  Admission Diagnoses:  Renal stone  Discharge Diagnoses:  Principal Problem:   Renal stone   Past Medical History  Diagnosis Date  . Palpitation   . SVT (supraventricular tachycardia)   . History of kidney stones   . Renal stone 06/17/2013    22mm left renal stone that has grown.  She also has small right renal stones.     Surgeries: Procedure(s): NEPHROLITHOTOMY PERCUTANEOUS on 06/17/2013   Consultants (if any):    Discharged Condition: Improved  Hospital Course: Cheyenne Riley is an 42 y.o. female who was admitted 06/17/2013 with a diagnosis of Renal stone and went to the operating room on 06/17/2013 and underwent the above named procedures.  She had a possible residual 3-18mm LLP stone on flouro that I couldn't access because it appeared to be in a parallel calyx.   She will have a KUB this morning to assess the stone.  If the stone has moved to the renal pelvis, I will consider a second look.   If the stone is still in the lower pole then, after discussion with the patient, I will remove the tube and send her home.   She has some pain as expected today but her NT urine is clear.   She was given perioperative antibiotics:  Anti-infectives   Start     Dose/Rate Route Frequency Ordered Stop   06/17/13 2000  ciprofloxacin (CIPRO) tablet 500 mg     500 mg Oral 2 times daily 06/17/13 1426     06/17/13 0941  ciprofloxacin (CIPRO) 400 MG/200ML IVPB  Status:  Discontinued    Comments:  Channing Mutters   : cabinet override      06/17/13 0941 06/17/13 1432   06/17/13 0000  ciprofloxacin (CIPRO) 500 MG tablet     500 mg Oral 2 times daily 06/17/13 1235      .  She was given sequential compression devices for DVT prophylaxis.  She benefited maximally from the hospital stay and there were no complications.    Recent vital  signs:  Filed Vitals:   06/18/13 0514  BP: 103/53  Pulse: 64  Temp: 98 F (36.7 C)  Resp: 18    Recent laboratory studies:  Lab Results  Component Value Date   HGB 14.6 06/13/2013   Lab Results  Component Value Date   WBC 8.3 06/13/2013   PLT 255 06/13/2013   Lab Results  Component Value Date   INR 0.99 06/13/2013   Lab Results  Component Value Date   NA 137 06/13/2013   K 3.9 06/13/2013   CL 104 06/13/2013   CO2 24 06/13/2013   BUN 12 06/13/2013   CREATININE 0.78 06/13/2013   GLUCOSE 117* 06/13/2013    Discharge Medications:     Medication List         ciprofloxacin 500 MG tablet  Commonly known as:  CIPRO  Take 1 tablet (500 mg total) by mouth 2 (two) times daily.     ibuprofen 200 MG tablet  Commonly known as:  ADVIL,MOTRIN  Take 200 mg by mouth every 6 (six) hours as needed for pain.     oxyCODONE-acetaminophen 5-325 MG per tablet  Commonly known as:  PERCOCET/ROXICET  Take 1 tablet by mouth every 4 (four) hours as needed for pain.     potassium citrate 10 MEQ (1080 MG) SR tablet  Commonly known as:  UROCIT-K  Take 10 mEq by mouth 3 (three) times daily with meals.        Diagnostic Studies: Ct Abdomen Pelvis Wo Contrast  06/17/2013   *RADIOLOGY REPORT*  Clinical Data: Evaluate renal stone burden prior to percutaneous nephrolithotomy  CT ABDOMEN AND PELVIS WITHOUT CONTRAST  Technique:  Multidetector CT imaging of the abdomen and pelvis was performed following the standard protocol without intravenous contrast.  Comparison: 12/12/2010; 11/16/2009; abdominal radiograph - 05/02/2013; 06/17/2012  Findings:  The lack of intravenous contrast limits the ability to evaluate solid abdominal organs.  There is grossly unchanged increased opacification of the bilateral renal calyces compatible with medullary nephrocalcinosis.  Left kidney:  Interval increase in the left-sided nonobstructive renal stone burden with dominant stone with the left renal pelvis measuring  approximately 1.4 x 0.7 cm (image 35, series 2).  There are multiple additional scattered punctate nonobstructing stones seen within multiple upper, mid and inferior renal calyces with dominant nonobstructing inferior calyceal stone measuring 0.4 cm in diameter (image 34).  There is minimal thickening about the left renal pelvis without evidence of urinary obstruction.  No perinephric stranding.  Right kidney:  There has been suspected passage of previously noted dominant approximately 0.9 cm stone within the caudal aspect of the right renal pelvis. Note again made of a right-sided extrarenal pelvis.  No evidence of urinary obstruction.  Interval progression of nonobstructive renal stone burden within the inferior calices of the right kidney.  Punctate (under 2 mm) nonobstructing stones within the superior pole right kidney have increased in number in the interval.  No stones are seen along the expected course of either ureter or the urinary bladder.  Normal noncontrast and the urinary bladder given degree of distension.  Multiple phleboliths overlie the lower pelvis.  -----------------------------------------------------  Normal hepatic contour.  Normal noncontrast appearance of the gallbladder to the degree of distension.  No ascites.  Normal noncontrast appearance of the bilateral adrenal glands and spleen.  Incidental note is made of a approximately the 0.5 x 0.8 cm hypoattenuating (image 24, series 2) lesion within the mid aspect of the pancreas are grossly unchanged and well which is too small to accurately characterize but may represent a lipoma.  Scattered minimal colonic diverticulosis without evidence of diverticulitis.  Moderate colonic stool burden without evidence of obstruction.  Normal noncontrast appearance of the appendix.  No pneumoperitoneum, pneumatosis or portal venous gas.  Normal caliber of the abdominal aorta.  Scattered shoddy retroperitoneal lymph nodes are not enlarged by CT criteria.  No  retroperitoneal, mesenteric, pelvic or inguinal lymphadenopathy.  There is an approximately 2.4 x 1.8 cm hypoattenuating (23 HU) left adnexal lesion which is favored to represent a minimally complex physiologic adnexal cyst.  Otherwise, normal noncontrast appearance of the pelvic organs.  No free fluid in the pelvis.  Limited visualization lower thorax there is minimal dependent subpleural ground-glass atelectasis.  No focal airspace opacity. No pleural effusion.  Normal heart size.  No pericardial effusion.  No acute or aggressive osseous abnormalities. Note is made of a wide neck mesenteric fat containing periumbilical hernia, unchanged.  IMPRESSION:  1.  Stable sequela of medullary nephrocalcinosis with interval increase in bilateral nonobstructing renal stone burden, left greater than right, including dominant approximately 1.4 cm stone within the left renal pelvis.  2.  Suspected passage of previously noted dominant approximately 0.9 cm right-sided pelvic stone.   Original Report Authenticated By: Tacey Ruiz, MD   Dg Abd 1 View  06/17/2013   CLINICAL DATA:  Kidney stone  EXAM: ABDOMEN - 1 VIEW  COMPARISON:  None.  FINDINGS: A 30 French ACCESS device is positioned with its tip projecting over the left renal pelvis. Contrast fills the collecting system of the left kidney.  IMPRESSION: Access into the left kidney or stone removal.   Electronically Signed   By: Maryclare Bean M.D.   On: 06/17/2013 13:33   Dg C-arm 1-60 Min-no Report  06/17/2013   CLINICAL DATA: percutaneous   C-ARM 1-60 MINUTES  Fluoroscopy was utilized by the requesting physician.  No radiographic  interpretation.    Ir [place] Abts Cath Perc Left  06/17/2013   *RADIOLOGY REPORT*  Indication:  History of medullary nephrocalcinosis, now with worsening left-sided nonobstructive renal stone burden - please obtain access for left-sided percutaneous nephrolithotomy.  1.  FLUOROSCOPIC GUIDANCE FOR PUNCTURE OF THE LEFT RENAL COLLECTING SYSTEM. 2.  LEFT PERCUTANEOUS NEPHROSTOMY TUBE PLACEMENT  Comparison: CT of the abdomen and pelvis - earlier same day; abdominal radiograph - 05/02/2013  Intravenous medications: Fentanyl 200 mcg IV; Versed 4 mg IV; Ciprofloxacin 400 mg IV; The antibiotic was administered in an appropriate time frame prior to skin puncture.  Total Moderate Sedation Time: 35 minutes.  Contrast: 30 mL Omnipaque-300 (administered into the collecting system)  Fluoroscopy Time: 6 minutes, 54-seconds.  Complications: None immediate  Procedure:  Informed written consent was obtained from the patient after a discussion of the risks, benefits, and alternatives to treatment. The left flank region was prepped with Betadine in a sterile fashion, and a sterile drape was applied covering the operative field.  A sterile gown and sterile gloves were used for the procedure.  A timeout was performed prior to the initiation of the procedure.  A pre procedural spot fluoroscopic image was obtained of the upper abdomen.  Ultrasound scanning performed of the kidney was negative for significant hydronephrosis.  As such, the stone within left renal pelvis was targeted fluoroscopically with a 22 gauge Chiba needle.  Access to the collecting system was confirmed with injection of a small amount of contrast.  A small amount of air was injected into the collecting system to help delineate a posterior calyx and multiple spot fluoroscopic and radiographic images were obtained in various obliquities.  A posterior inferior calyx was targeted with a 22 gauge Chiba needle.  Access to the calyx was confirmed with advancement of a Nitrex wire into the collecting system.  An Accustick set was utilized to dilate the tract and was subsequently exchanged for a Kumpe catheter over a Bentson wire. The Kumpe catheter was advanced down the ureter and into the urinary bladder.  Postprocedural spot radiographs were obtained in various obliquities and the catheter was sutured to the skin.   The catheter was capped and a dressing was placed.  The patient tolerated the procedure well without immediate postprocedural complication.  Findings:  Pre procedural spot radiographic images demonstrates unchanged appearance of the approximately 1.8 x 1.0 cm dominant nonobstructing stone overlying the expected location of the left renal pelvis demonstrated on noncontrast abdominal CT performed earlier same day.  Several additional last well-defined punctate opacities overlying the expected location of the mid and inferior calyces.  Ultrasound scanning was negative for significant hydronephrosis and as such, the stone was targeted fluoroscopically allowing access to the collecting system.  With the collecting system now opacified, a posterior inferior calyx was targeted fluoroscopically allowing placement of a Kumpe catheter through the calyx with tip advanced into the urinary  bladder.  IMPRESSION:  Successful fluoroscopic guided placement of a left sided 5 [place] Kumpe catheter to the level of the urinary bladder to be utilized during impending nephrolithotomy procedure.   Original Report Authenticated By: Tacey Ruiz, MD    Disposition: 01-Home or Self Care       Signed: Anner Crete 06/18/2013, 6:21 AM  Her CT scan showed nephrocalcinosis without obvious fragments in the left collecting system.  Her tube was removed and the site was dressed.  She was discharged home.

## 2013-06-18 NOTE — Progress Notes (Signed)
Patient ID: CARLIA BOMKAMP, female   DOB: 1970-10-06, 42 y.o.   MRN: [id number]  KUB shows a possible 6.42mm residual stone.  I discussed the finding with Victorino Dike and since it is not 100% certain that this is a fragment, I am going to get a CT abd to confirm.  If she does have a 6mm fragment, she will need a second look

## 2013-06-18 NOTE — Op Note (Signed)
Cheyenne Riley, Cheyenne Riley              ACCOUNT NO.:  [id number]  MEDICAL RECORD NO.:  [id number]  LOCATION:  [facility]                          FACILITY:  [facility]  PHYSICIAN:  Excell Seltzer. Annabell Howells, M.D.    DATE OF BIRTH:  12/30/1970  DATE OF PROCEDURE:  06/17/2013 DATE OF DISCHARGE:  06/18/2013                              OPERATIVE REPORT   PROCEDURE:  Left percutaneous nephrolithotomy for greater than 2 cm stone.  PREOPERATIVE DIAGNOSIS:  A 22-mm left renal pelvic stone.  POSTOPERATIVE DIAGNOSIS:  A 22-mm left renal pelvic stone.  SURGEON:  Excell Seltzer. Annabell Howells, MD  ANESTHESIA:  General.  SPECIMEN:  Stone fragments.  BLOOD LOSS:  50 mL.  DRAINS:  A 24-French Ainsworth nephrostomy tube, 6-French Kumpe safety catheter, and 18-French Foley catheter.  COMPLICATIONS:  None.  INDICATIONS:  Cheyenne Riley is a 42 year old white female with history of urolithiasis who has metabolically active stone in her left renal pelvis that had grown from approximately 16 mm to 22 mm over the past year on KUB based measurements from the office.  After discussing the options, we elected to proceed with percutaneous nephrolithotomy because of the size of the stone.  FINDINGS OF THE PROCEDURE:  She was given Cipro and taken to Radiology earlier today where a left lower pole access was placed without difficulty.  A CT scan was done to aid placement, this revealed a stone in the left renal pelvis.  She also had some punctate stones throughout the calices with some nephrocalcinosis and a 3.8 mm stone in the left lower pole.  Once access had been created, she was taken to the operating room where general anesthetic was induced on the holding room stretcher.  A Foley catheter was placed using sterile technique.  She was then rolled prone on chest rolls with great care being taken to pad all pressure points. She was fitted with PAS hose.  Her nephrostomy site was then prepped with Betadine solution and she was draped in the  usual sterile fashion with Lingeman drapes.  The previously placed access catheter was then wired with an Amplatz wire, which was placed to the bladder.  The catheter was removed over the wire and the nephrostomy site incision was lengthened to approximately 2 cm.  A 10-French peel-away sheath was then placed over the working wire into the proximal ureter.  The inner core was removed and a second wire was placed to the bladder.  The peel-away sheath was then removed and the track master nephrostomy balloon was then passed over the working wire until the tip was at the level of the stone on fluoroscopic imaging.  The balloon was then inflated to 18 atmospheres without difficulty and the 32-French sheath was passed over the balloon. The balloon was then removed leaving the working wire in place.  Rigid nephroscope with a 24-French sheath was then passed through the nephrostomy sheath and the stone was readily identified at the renal pelvis, it was felt to be too large to remove intact so the Erwin CO2 lithotrite was then used to fragment the stone into manageable fragments.  These fragments were then removed with a two-prong grasper.  Once all the fragments had  been removed from the central stone, fluoroscopic imaging suggested another stone in the lower pole. Multiple attempts were made to access this location including the use of an antegrade nephrostogram with the flexible and rigid scopes.  I thought I had washed the fragment into the renal pelvis but was never able to identify it and it is possible it could have been in parallel calix that was not accessible from higher our access tract, however, the fragment was small and could be submucosal as well.  Once all efforts have been made to remove any identifiable fragments the nephroscope was removed and a 24-French Ainsworth catheter was passed over the working wire into the renal pelvis.  The working wire was removed and the sheath  was backed out.  The Ainsworth balloon was filled with 3 mL of sterile water.  Contrast was instilled which revealed good placement and no extravasation.  A 6-French short Kumpe catheter was then passed over the safety wire into the distal ureter and that wire was removed as well.  The nephrostomy tube and Kumpe catheter was secured to the skin with a 0 silk suture.  The drapes were removed.  The wound was cleansed and a dressing of 4x4s and Tegaderm was applied.  The nephrostomy tube had been placed to straight drainage.  The Kumpe catheter had been capped.  She was then rolled back supine on the recovery room stretcher.  Anesthetic was reversed.  She was taken to recovery room in stable condition.  There were no complications.  The stone fragments will be given to the family to bring to our office for analysis.     Excell Seltzer. Annabell Howells, M.D.     JJW/MEDQ  D:  06/17/2013  T:  06/18/2013  Job:  [id number]

## 2013-07-02 ENCOUNTER — Telehealth: Payer: Self-pay | Admitting: Internal Medicine

## 2013-07-02 NOTE — Telephone Encounter (Signed)
Please be advised patient is no longer being seen here. Records were transferred to [facility] in  [place], [place] phone [phone] on February 18, 2013

## 2020-02-02 ENCOUNTER — Other Ambulatory Visit: Payer: Self-pay

## 2020-02-02 ENCOUNTER — Encounter (HOSPITAL_BASED_OUTPATIENT_CLINIC_OR_DEPARTMENT_OTHER): Payer: Self-pay | Admitting: Emergency Medicine

## 2020-02-02 ENCOUNTER — Emergency Department (HOSPITAL_BASED_OUTPATIENT_CLINIC_OR_DEPARTMENT_OTHER): Payer: BC Managed Care – PPO

## 2020-02-02 ENCOUNTER — Emergency Department (HOSPITAL_BASED_OUTPATIENT_CLINIC_OR_DEPARTMENT_OTHER)
Admission: EM | Admit: 2020-02-02 | Discharge: 2020-02-02 | Disposition: A | Discharge status: Home / Self Care | Payer: BC Managed Care – PPO | Attending: Emergency Medicine | Admitting: Emergency Medicine

## 2020-02-02 DIAGNOSIS — R1031 Right lower quadrant pain: Secondary | ICD-10-CM | POA: Insufficient documentation

## 2020-02-02 DIAGNOSIS — F17210 Nicotine dependence, cigarettes, uncomplicated: Secondary | ICD-10-CM | POA: Insufficient documentation

## 2020-02-02 DIAGNOSIS — N200 Calculus of kidney: Secondary | ICD-10-CM | POA: Insufficient documentation

## 2020-02-02 DIAGNOSIS — R112 Nausea with vomiting, unspecified: Secondary | ICD-10-CM | POA: Diagnosis not present

## 2020-02-02 DIAGNOSIS — R109 Unspecified abdominal pain: Secondary | ICD-10-CM

## 2020-02-02 DIAGNOSIS — Z91040 Latex allergy status: Secondary | ICD-10-CM | POA: Insufficient documentation

## 2020-02-02 LAB — PREGNANCY, URINE: Preg Test, Ur: NEGATIVE

## 2020-02-02 LAB — CBC
HCT: 46.3 % — ABNORMAL HIGH (ref 36.0–46.0)
Hemoglobin: 14.8 g/dL (ref 12.0–15.0)
MCH: 28.5 pg (ref 26.0–34.0)
MCHC: 32 g/dL (ref 30.0–36.0)
MCV: 89 fL (ref 80.0–100.0)
Platelets: 324 10*3/uL (ref 150–400)
RBC: 5.2 MIL/uL — ABNORMAL HIGH (ref 3.87–5.11)
RDW: 13.2 % (ref 11.5–15.5)
WBC: 9.6 10*3/uL (ref 4.0–10.5)
nRBC: 0 % (ref 0.0–0.2)

## 2020-02-02 LAB — BASIC METABOLIC PANEL
Anion gap: 10 (ref 5–15)
BUN: 16 mg/dL (ref 6–20)
CO2: 25 mmol/L (ref 22–32)
Calcium: 9.1 mg/dL (ref 8.9–10.3)
Chloride: 102 mmol/L (ref 98–111)
Creatinine, Ser: 0.99 mg/dL (ref 0.44–1.00)
GFR calc Af Amer: >60 mL/min (ref >60)
GFR calc non Af Amer: >60 mL/min (ref >60)
Glucose, Bld: 115 mg/dL — ABNORMAL HIGH (ref 70–99)
Potassium: 3.8 mmol/L (ref 3.5–5.1)
Sodium: 137 mmol/L (ref 135–145)

## 2020-02-02 LAB — URINALYSIS, ROUTINE W REFLEX MICROSCOPIC
Bilirubin Urine: NEGATIVE
Glucose, UA: NEGATIVE mg/dL
Ketones, ur: NEGATIVE mg/dL
Leukocytes,Ua: NEGATIVE
Nitrite: NEGATIVE
Protein, ur: NEGATIVE mg/dL
Specific Gravity, Urine: 1.015 (ref 1.005–1.030)
pH: 7 (ref 5.0–8.0)

## 2020-02-02 LAB — URINALYSIS, MICROSCOPIC (REFLEX)

## 2020-02-02 MED ORDER — SODIUM CHLORIDE 0.9 % IV BOLUS (SEPSIS)
1000.0000 mL | Freq: Once | INTRAVENOUS | Status: AC
  Administered 2020-02-02: 1000 mL via INTRAVENOUS

## 2020-02-02 MED ORDER — KETOROLAC TROMETHAMINE 15 MG/ML IJ SOLN
15.0000 mg | Freq: Once | INTRAMUSCULAR | Status: AC
  Administered 2020-02-02: 15 mg via INTRAVENOUS
  Filled 2020-02-02: qty 1

## 2020-02-02 MED ORDER — ONDANSETRON HCL 4 MG/2ML IJ SOLN
4.0000 mg | Freq: Once | INTRAMUSCULAR | Status: AC
  Administered 2020-02-02: 4 mg via INTRAVENOUS
  Filled 2020-02-02: qty 2

## 2020-02-02 NOTE — ED Provider Notes (Signed)
[facility] EMERGENCY DEPARTMENT Provider Note   CSN: [id number] Arrival date & time: 02/02/20  1458     History Chief Complaint  Patient presents with  . Flank Pain    Cheyenne Riley is a 49 y.o. female.  This is a 49 year old female with history of kidney stones presenting to the emergency department for acute onset of right flank pain at 10 AM this morning with nausea and vomiting.  Reports that this feels similar to a kidney stone she has had in the past.  Reports initially some difficulty urinating but was able to urinate just now without a problem.  Denies any hematuria, vaginal bleeding, vaginal burning with urination.        Past Medical History:  Diagnosis Date  . History of kidney stones   . Palpitation   . Renal stone 06/17/2013   35mm left renal stone that has grown.  She also has small right renal stones.   . SVT (supraventricular tachycardia) [facility])     Patient Active Problem List   Diagnosis Date Noted  . Renal stone 06/17/2013    Past Surgical History:  Procedure Laterality Date  . NEPHROLITHOTOMY Left 06/17/2013   Procedure: NEPHROLITHOTOMY PERCUTANEOUS;  Surgeon: Bjorn Pippin, MD;  Location: [facility] ORS;  Service: Urology;  Laterality: Left;  . TONSILLECTOMY  1978     OB History   No obstetric history on file.     No family history on file.  Social History   Tobacco Use  . Smoking status: Current Every Day Smoker    Packs/day: 1.00  . Smokeless tobacco: Never Used  Substance Use Topics  . Alcohol use: No  . Drug use: No    Home Medications Prior to Admission medications   Medication Sig Start Date End Date Taking? Authorizing Provider  ciprofloxacin (CIPRO) 500 MG tablet Take 1 tablet (500 mg total) by mouth 2 (two) times daily. 06/17/13   Bjorn Pippin, MD  ibuprofen (ADVIL,MOTRIN) 200 MG tablet Take 200 mg by mouth every 6 (six) hours as needed for pain.    [provider]  oxyCODONE-acetaminophen (PERCOCET/ROXICET)  5-325 MG per tablet Take 1 tablet by mouth every 4 (four) hours as needed for pain. 06/18/13   Bjorn Pippin, MD  potassium citrate (UROCIT-K) 10 MEQ (1080 MG) SR tablet Take 10 mEq by mouth 3 (three) times daily with meals.    [provider]    Allergies    Banana and Latex  Review of Systems   Review of Systems  Constitutional: Negative for appetite change, chills and fever.  Gastrointestinal: Positive for abdominal pain, nausea and vomiting.  Genitourinary: Positive for flank pain. Negative for dysuria and hematuria.  All other systems reviewed and are negative.   Physical Exam Updated Vital Signs BP 135/86 (BP Location: Right Arm)   Pulse 98   Temp 97.7 F (36.5 C) (Oral)   Resp 20   Ht 5\' 2"  (1.575 m)   Wt 114.3 kg   LMP 01/02/2020 (Approximate)   SpO2 99%   BMI 46.09 kg/m   Physical Exam Constitutional:      General: She is not in acute distress.    Appearance: Normal appearance. She is not ill-appearing, toxic-appearing or diaphoretic.  HENT:     Head: Normocephalic and atraumatic.     Mouth/Throat:     Mouth: Mucous membranes are moist.  Eyes:     Pupils: Pupils are equal, round, and reactive to light.  Cardiovascular:  Rate and Rhythm: Normal rate and regular rhythm.  Pulmonary:     Effort: Pulmonary effort is normal.     Breath sounds: Normal breath sounds.  Abdominal:     General: Abdomen is flat.     Tenderness: There is abdominal tenderness in the right lower quadrant. There is right CVA tenderness.  Skin:    General: Skin is warm.  Neurological:     Mental Status: She is alert.     ED Results / Procedures / Treatments   Labs (all labs ordered are listed, but only abnormal results are displayed) Labs Reviewed  URINALYSIS, ROUTINE W REFLEX MICROSCOPIC - Abnormal; Notable for the following components:      Result Value   Hgb urine dipstick SMALL (*)    All other components within normal limits  BASIC METABOLIC PANEL - Abnormal;  Notable for the following components:   Glucose, Bld 115 (*)    All other components within normal limits  CBC - Abnormal; Notable for the following components:   RBC 5.20 (*)    HCT 46.3 (*)    All other components within normal limits  URINALYSIS, MICROSCOPIC (REFLEX) - Abnormal; Notable for the following components:   Bacteria, UA RARE (*)    All other components within normal limits  URINE CULTURE  PREGNANCY, URINE    EKG None  Radiology CT Renal Stone Study  Result Date: 02/02/2020 CLINICAL DATA:  49 year old female with history of right-sided flank pain since 10 a.m. today. Vomiting. EXAM: CT ABDOMEN AND PELVIS WITHOUT CONTRAST TECHNIQUE: Multidetector CT imaging of the abdomen and pelvis was performed following the standard protocol without IV contrast. COMPARISON:  CT the abdomen and pelvis 06/18/2013. FINDINGS: Lower chest: Unremarkable. Hepatobiliary: Mild diffuse low attenuation throughout the hepatic parenchyma, indicative of hepatic steatosis. No definite suspicious cystic or solid hepatic lesions are confidently identified on today's noncontrast CT examination. Unenhanced appearance of the gallbladder is normal. Pancreas: No definite pancreatic mass or peripancreatic fluid collections or inflammatory changes are noted on today's noncontrast CT examination. Spleen: Unremarkable. Adrenals/Urinary Tract: Multiple nonobstructive calculi are noted in the collecting systems of the kidneys bilaterally, largest of which is in the right renal pelvis measuring 9 mm. No additional calculi are identified along the course of either ureter or within the lumen of the urinary bladder. No hydroureter. Mild right-sided hydronephrosis, which could suggest the presence of a right ureterovesicular junction obstruction. No suspicious renal lesions. Unenhanced appearance of the urinary bladder is normal. Bilateral adrenal glands are normal in appearance. Stomach/Bowel: Unenhanced appearance of the stomach  is normal. No pathologic dilatation of small bowel or colon. Normal appendix. Vascular/Lymphatic: Aortic atherosclerosis. No lymphadenopathy noted in the abdomen or pelvis. Reproductive: Unenhanced appearance of the uterus and ovaries is normal. Other: No significant volume of ascites.  No pneumoperitoneum. Musculoskeletal: There are no aggressive appearing lytic or blastic lesions noted in the visualized portions of the skeleton. IMPRESSION: 1. Numerous nonobstructive calculi are noted within the collecting systems of both kidneys measuring up to 9 mm in the right renal pelvis. 2. At this time, there is some mild right hydroureteronephrosis which terminates at the ureteropelvic junction. This may suggest the presence of mild right UPJ obstruction, potentially from a stricture as this is new compared to the remote prior study from 2014. 3. Hepatic steatosis. 4. Aortic atherosclerosis. Electronically Signed   By: Vinnie Langton M.D.   On: 02/02/2020 16:04    Procedures Procedures (including critical care time)  Medications Ordered in ED  Medications  ketorolac (TORADOL) 15 MG/ML injection 15 mg (15 mg Intravenous Given 02/02/20 1529)  sodium chloride 0.9 % bolus 1,000 mL (1,000 mLs Intravenous New Bag/Given 02/02/20 1533)  ondansetron (ZOFRAN) injection 4 mg (4 mg Intravenous Given 02/02/20 1529)    ED Course  I have reviewed the triage vital signs and the nursing notes.  Pertinent labs & imaging results that were available during my care of the patient were reviewed by me and considered in my medical decision making (see chart for details).  Clinical Course as of Feb 01 1654  Mon Feb 02, 2020  1646 Patient with history of kidney stones presenting with acute onset of right flank pain consistent with kidney stone pain in the past.  With fluids and Toradol her pain is resolved.  She has a normal white count.  I will culture her urine.  Her CT scan did not show an obstructive stone but did show some mild  hydronephrosis and signs of possibly recently passed stone.  Also question of a stricture.  Given her history I feel that this more likely reflects a recently passed stone.  I discussed all this with the patient and she will have close follow-up with her urologist.  She was advised on strict return precautions. Discussed with Dr. Bernette Mayers and plan agreed upon   [KM]    Clinical Course User Index [KM] Jeral Pinch   MDM Rules/Calculators/A&P                      Based on review of vitals, medical screening exam, lab work and/or imaging, there does not appear to be an acute, emergent etiology for the patient's symptoms. Counseled pt on good return precautions and encouraged both PCP and ED follow-up as needed.  Prior to discharge, I also discussed incidental imaging findings with patient in detail and advised appropriate, recommended follow-up in detail.  Clinical Impression: 1. Flank pain     Disposition: Discharge  Prior to providing a prescription for a controlled substance, I independently reviewed the patient's recent prescription history on the [place] Controlled Substance Reporting System. The patient had no recent or regular prescriptions and was deemed appropriate for a brief, less than 3 day prescription of narcotic for acute analgesia.  This note was prepared with assistance of Conservation officer, historic buildings. Occasional wrong-word or sound-a-like substitutions may have occurred due to the inherent limitations of voice recognition software.  Final Clinical Impression(s) / ED Diagnoses Final diagnoses:  Flank pain    Rx / DC Orders ED Discharge Orders    None       Jeral Pinch 02/02/20 1655    Pollyann Savoy, MD 02/02/20 1735

## 2020-02-02 NOTE — Discharge Instructions (Addendum)
You are seen today for flank pain.  According to your CT scan it looks like the probably had a kidney stone that has passed today.  It is important that you follow-up with your urologist in the next couple of days as sometimes these same findings can reflect a stricture in one of you ureters. This is less likely since your pain is now resolved and you are urinating regularly. If you have ANY new or worsening symptoms please return to the ER for re-evaluation.

## 2020-02-02 NOTE — ED Triage Notes (Signed)
Right Flank pain since 1000 today. Hx of Kidney stones. Vomiting PTA.

## 2020-02-03 LAB — URINE CULTURE: Culture: <10000 — AB

## 2020-04-05 ENCOUNTER — Other Ambulatory Visit: Payer: Self-pay | Admitting: Urology

## 2020-04-05 ENCOUNTER — Other Ambulatory Visit (HOSPITAL_COMMUNITY): Payer: Self-pay | Admitting: Urology

## 2020-04-05 DIAGNOSIS — N130 Hydronephrosis with ureteropelvic junction obstruction: Secondary | ICD-10-CM

## 2020-04-06 ENCOUNTER — Encounter (HOSPITAL_COMMUNITY)
Admission: RE | Admit: 2020-04-06 | Discharge: 2020-04-06 | Disposition: A | Discharge status: Home / Self Care | Payer: BC Managed Care – PPO | Source: Ambulatory Visit | Attending: Urology | Admitting: Urology

## 2020-04-06 ENCOUNTER — Other Ambulatory Visit: Payer: Self-pay

## 2020-04-06 DIAGNOSIS — N130 Hydronephrosis with ureteropelvic junction obstruction: Secondary | ICD-10-CM

## 2020-04-06 MED ORDER — FUROSEMIDE 10 MG/ML IJ SOLN
56.0000 mg | Freq: Once | INTRAMUSCULAR | Status: AC
  Administered 2020-04-06: 56 mg via INTRAVENOUS

## 2020-04-06 MED ORDER — TECHNETIUM TC 99M MERTIATIDE
5.2000 | Freq: Once | INTRAVENOUS | Status: AC | PRN
  Administered 2020-04-06: 5.2 via INTRAVENOUS

## 2020-04-06 MED ORDER — FUROSEMIDE 10 MG/ML IJ SOLN
INTRAMUSCULAR | Status: AC
  Filled 2020-04-06: qty 4

## 2020-04-06 MED ORDER — FUROSEMIDE 10 MG/ML IJ SOLN
INTRAMUSCULAR | Status: AC
  Filled 2020-04-06: qty 6

## 2020-04-06 MED ORDER — FUROSEMIDE 10 MG/ML IJ SOLN
INTRAMUSCULAR | Status: AC
  Filled 2020-04-06: qty 2

## 2020-04-08 ENCOUNTER — Other Ambulatory Visit: Payer: Self-pay | Admitting: Urology

## 2020-05-13 ENCOUNTER — Other Ambulatory Visit (HOSPITAL_COMMUNITY)
Admission: RE | Admit: 2020-05-13 | Discharge: 2020-05-13 | Disposition: A | Discharge status: Home / Self Care | Payer: BC Managed Care – PPO | Source: Ambulatory Visit | Attending: Urology | Admitting: Urology

## 2020-05-13 DIAGNOSIS — Z01812 Encounter for preprocedural laboratory examination: Secondary | ICD-10-CM | POA: Diagnosis not present

## 2020-05-13 DIAGNOSIS — Z20822 Contact with and (suspected) exposure to covid-19: Secondary | ICD-10-CM | POA: Insufficient documentation

## 2020-05-13 LAB — SARS CORONAVIRUS 2 (TAT 6-24 HRS): SARS Coronavirus 2: NEGATIVE

## 2020-05-16 ENCOUNTER — Encounter (HOSPITAL_BASED_OUTPATIENT_CLINIC_OR_DEPARTMENT_OTHER): Payer: Self-pay | Admitting: Anesthesiology

## 2020-05-17 ENCOUNTER — Encounter (HOSPITAL_BASED_OUTPATIENT_CLINIC_OR_DEPARTMENT_OTHER)
Admission: RE | Disposition: A | Discharge status: Home / Self Care | Payer: Self-pay | Source: Home / Self Care | Attending: Urology

## 2020-05-17 ENCOUNTER — Ambulatory Visit (HOSPITAL_BASED_OUTPATIENT_CLINIC_OR_DEPARTMENT_OTHER)
Admission: RE | Admit: 2020-05-17 | Discharge: 2020-05-17 | Disposition: A | Discharge status: Home / Self Care | Payer: BC Managed Care – PPO | Attending: Urology | Admitting: Urology

## 2020-05-17 ENCOUNTER — Ambulatory Visit (HOSPITAL_COMMUNITY): Payer: BC Managed Care – PPO

## 2020-05-17 ENCOUNTER — Encounter (HOSPITAL_BASED_OUTPATIENT_CLINIC_OR_DEPARTMENT_OTHER): Payer: Self-pay | Admitting: Urology

## 2020-05-17 ENCOUNTER — Other Ambulatory Visit: Payer: Self-pay

## 2020-05-17 DIAGNOSIS — I10 Essential (primary) hypertension: Secondary | ICD-10-CM | POA: Diagnosis not present

## 2020-05-17 DIAGNOSIS — Z6841 Body Mass Index (BMI) 40.0 and over, adult: Secondary | ICD-10-CM | POA: Insufficient documentation

## 2020-05-17 DIAGNOSIS — E669 Obesity, unspecified: Secondary | ICD-10-CM | POA: Diagnosis not present

## 2020-05-17 DIAGNOSIS — Z79899 Other long term (current) drug therapy: Secondary | ICD-10-CM | POA: Insufficient documentation

## 2020-05-17 DIAGNOSIS — N200 Calculus of kidney: Secondary | ICD-10-CM | POA: Insufficient documentation

## 2020-05-17 HISTORY — PX: EXTRACORPOREAL SHOCK WAVE LITHOTRIPSY: SHX1557

## 2020-05-17 LAB — POCT PREGNANCY, URINE: Preg Test, Ur: NEGATIVE

## 2020-05-17 SURGERY — LITHOTRIPSY, ESWL
Anesthesia: LOCAL | Laterality: Right

## 2020-05-17 MED ORDER — DIPHENHYDRAMINE HCL 25 MG PO CAPS
ORAL_CAPSULE | ORAL | Status: AC
  Filled 2020-05-17: qty 1

## 2020-05-17 MED ORDER — OXYCODONE-ACETAMINOPHEN 5-325 MG PO TABS
1.0000 | ORAL_TABLET | ORAL | 0 refills | Status: DC | PRN
Start: 2020-05-17 — End: 2020-05-17

## 2020-05-17 MED ORDER — OXYCODONE-ACETAMINOPHEN 5-325 MG PO TABS
1.0000 | ORAL_TABLET | ORAL | 0 refills | Status: AC | PRN
Start: 2020-05-17 — End: ?

## 2020-05-17 MED ORDER — DIPHENHYDRAMINE HCL 25 MG PO CAPS
25.0000 mg | ORAL_CAPSULE | ORAL | Status: AC
  Administered 2020-05-17: 25 mg via ORAL

## 2020-05-17 MED ORDER — SODIUM CHLORIDE 0.9 % IV SOLN: INTRAVENOUS | Status: DC

## 2020-05-17 MED ORDER — DIAZEPAM 5 MG PO TABS
ORAL_TABLET | ORAL | Status: AC
  Filled 2020-05-17: qty 2

## 2020-05-17 MED ORDER — SODIUM CHLORIDE 0.9% FLUSH: 3.0000 mL | Freq: Two times a day (BID) | INTRAVENOUS | Status: DC

## 2020-05-17 MED ORDER — CIPROFLOXACIN HCL 500 MG PO TABS
ORAL_TABLET | ORAL | Status: AC
  Filled 2020-05-17: qty 1

## 2020-05-17 MED ORDER — DIAZEPAM 5 MG PO TABS
10.0000 mg | ORAL_TABLET | ORAL | Status: AC
  Administered 2020-05-17: 10 mg via ORAL

## 2020-05-17 MED ORDER — CIPROFLOXACIN HCL 500 MG PO TABS
500.0000 mg | ORAL_TABLET | ORAL | Status: AC
  Administered 2020-05-17: 500 mg via ORAL

## 2020-05-17 NOTE — Discharge Instructions (Signed)
Lithotripsy, Care After This sheet gives you information about how to care for yourself after your procedure. Your health care provider may also give you more specific instructions. If you have problems or questions, contact your health care provider. What can I expect after the procedure? After the procedure, it is common to have:  Some blood in your urine. This should only last for a few days.  Soreness in your back, sides, or upper abdomen for a few days.  Blotches or bruises on your back where the pressure wave entered the skin.  Pain, discomfort, or nausea when pieces (fragments) of the kidney stone move through the tube that carries urine from the kidney to the bladder (ureter). Stone fragments may pass soon after the procedure, but they may continue to pass for up to 4-8 weeks. ? If you have severe pain or nausea, contact your health care provider. This may be caused by a large stone that was not broken up, and this may mean that you need more treatment.  Some pain or discomfort during urination.  Some pain or discomfort in the lower abdomen or (in men) at the base of the penis. Follow these instructions at home: Medicines  Take over-the-counter and prescription medicines only as told by your health care provider.  If you were prescribed an antibiotic medicine, take it as told by your health care provider. Do not stop taking the antibiotic even if you start to feel better.  Do not drive for 24 hours if you were given a medicine to help you relax (sedative).  Do not drive or use heavy machinery while taking prescription pain medicine. Eating and drinking      Drink enough water and fluids to keep your urine clear or pale yellow. This helps any remaining pieces of the stone to pass. It can also help prevent new stones from forming.  Eat plenty of fresh fruits and vegetables.  Follow instructions from your health care provider about eating and drinking restrictions. You may be  instructed: ? To reduce how much salt (sodium) you eat or drink. Check ingredients and nutrition facts on packaged foods and beverages. ? To reduce how much meat you eat.  Eat the recommended amount of calcium for your age and gender. Ask your health care provider how much calcium you should have. General instructions  Get plenty of rest.  Most people can resume normal activities 1-2 days after the procedure. Ask your health care provider what activities are safe for you.  Your health care provider may direct you to lie in a certain position (postural drainage) and tap firmly (percuss) over your kidney area to help stone fragments pass. Follow instructions as told by your health care provider.  If directed, strain all urine through the strainer that was provided by your health care provider. ? Keep all fragments for your health care provider to see. Any stones that are found may be sent to a medical lab for examination. The stone may be as small as a grain of salt.  Keep all follow-up visits as told by your health care provider. This is important. Contact a health care provider if:  You have pain that is severe or does not get better with medicine.  You have nausea that is severe or does not go away.  You have blood in your urine longer than your health care provider told you to expect.  You have more blood in your urine.  You have pain during urination that does   not go away.  You urinate more frequently than usual and this does not go away.  You develop a rash or any other possible signs of an allergic reaction. Get help right away if:  You have severe pain in your back, sides, or upper abdomen.  You have severe pain while urinating.  Your urine is very dark red.  You have blood in your stool (feces).  You cannot pass any urine at all.  You feel a strong urge to urinate after emptying your bladder.  You have a fever or chills.  You develop shortness of breath,  difficulty breathing, or chest pain.  You have severe nausea that leads to persistent vomiting.  You faint. Summary  After this procedure, it is common to have some pain, discomfort, or nausea when pieces (fragments) of the kidney stone move through the tube that carries urine from the kidney to the bladder (ureter). If this pain or nausea is severe, however, you should contact your health care provider.  Most people can resume normal activities 1-2 days after the procedure. Ask your health care provider what activities are safe for you.  Drink enough water and fluids to keep your urine clear or pale yellow. This helps any remaining pieces of the stone to pass, and it can help prevent new stones from forming.  If directed, strain your urine and keep all fragments for your health care provider to see. Fragments or stones may be as small as a grain of salt.  Get help right away if you have severe pain in your back, sides, or upper abdomen or have severe pain while urinating. This information is not intended to replace advice given to you by your health care provider. Make sure you discuss any questions you have with your health care provider. Document Revised: 11/25/2018 Document Reviewed: 07/05/2016 Elsevier Patient Education  2020 Elsevier Inc.  

## 2020-05-17 NOTE — H&P (Signed)
I have kidney stones.  HPI: Cheyenne Riley is a 49 year-old female patient who is here for renal calculi.  The problem is on both sides.   Cheyenne Riley was seen in the ER on 6/7 for a right ureteral stone. She feels she passed the stone prior to the CT but was also found to have several right renal stones with 59mm and 21mm stones in the renal pelvis and a 55mm cluster in the RLP. There was a question of a mild UPJ obstruction as well. She has had no hematuria and has no significant flank pain but she can have some pain with increased activity and she improves when she lies on her stomach. She had a left PCNL several years ago and was last seen in 2015 by [place]. She has had one prior ESWL and would prefer that option over surgical procedures.      ALLERGIES: Latex No Allergies    MEDICATIONS: Losartan Potassium  Potassium Citrate Er 10 meq (1,080 mg) tablet, extended release 1 tablet PO TID  Vitamin D3     GU PSH: ESWL - 2012 Percut Stone Removal >2cm - 2014       PSH Notes: Percutaneous Lithotomy For Stone Over 2cm., Lithotripsy, Hysteroscopy With Endometrial Ablation, Tonsillectomy With Adenoidectomy   NON-GU PSH: Hysteroscopy; Ablation - 2011 Remove Tonsils And Adenoids - 2011     GU PMH: Renal calculus, Bilateral kidney stones - 2015 History of urolithiasis, Nephrolithiasis - 2014 Renal cyst, Renal cyst, acquired - 2014 Ureteral calculus, Ureteral Stone - 2014    NON-GU PMH: Encounter for general adult medical examination without abnormal findings, Encounter for preventive health examination - 2015 Renal leak hypercalciuria, Nephrocalcinosis - 2014 Personal history of other diseases of the nervous system and sense organs, History of migraine headaches - 2014 Arrhythmia Hypertension    FAMILY HISTORY: Atherosclerosis - Mother Family Health Status - Father alive at age 53 - Mother Family Health Status - Mother's Age - Mother Family Health Status Number - Mother Heart Disease -  Mother nephrolithiasis - Father   SOCIAL HISTORY: Marital Status: Married Current Smoking Status: Patient does not smoke anymore. Has not smoked since 02/26/2015. Smoked for 30 years.   Tobacco Use Assessment Completed: Used Tobacco in last 30 days? Light Drinker.  Drinks 2 caffeinated drinks per day. Patient's occupation Economist.     Notes: Current every day smoker, Tobacco Use, Occupation:, Marital History - Currently Married, Alcohol Use, Caffeine Use 4-6 soda per day   REVIEW OF SYSTEMS:    GU Review Female:   Patient reports leakage of urine. Patient denies frequent urination, hard to postpone urination, burning /pain with urination, get up at night to urinate, stream starts and stops, trouble starting your stream, have to strain to urinate, and being pregnant.  Gastrointestinal (Upper):   Patient denies nausea, vomiting, and indigestion/ heartburn.  Gastrointestinal (Lower):   Patient denies diarrhea and constipation.  Constitutional:   Patient reports fatigue. Patient denies fever, night sweats, and weight loss.  Skin:   Patient denies skin rash/ lesion and itching.  Eyes:   Patient denies blurred vision and double vision.  Ears/ Nose/ Throat:   Patient denies sore throat and sinus problems.  Hematologic/Lymphatic:   Patient denies swollen glands and easy bruising.  Cardiovascular:   Patient denies leg swelling and chest pains.  Respiratory:   Patient denies cough and shortness of breath.  Endocrine:   Patient denies excessive thirst.  Musculoskeletal:   Patient denies back pain  and joint pain.  Neurological:   Patient reports headaches. Patient denies dizziness.  Psychologic:   Patient denies depression and anxiety.   VITAL SIGNS:      03/15/2020 11:05 AM  Weight 250 lb / 113.4 kg  Height 62 in / 157.48 cm  BP 138/90 mmHg  Heart Rate 69 /min  Temperature 97.7 F / 36.5 C  BMI 45.7 kg/m   MULTI-SYSTEM PHYSICAL EXAMINATION:    Constitutional: Well-nourished.  No physical deformities. Normally developed. Good grooming.   Neck: Neck symmetrical, not swollen. Normal tracheal position.  Respiratory: Normal breath sounds. No labored breathing, no use of accessory muscles.   Cardiovascular: Regular rate and rhythm. No murmur, no gallop. Normal temperature, normal extremity pulses, no swelling, no varicosities.   Skin: No paleness, no jaundice, no cyanosis. No lesion, no ulcer, no rash.  Neurologic / Psychiatric: Oriented to time, oriented to place, oriented to person. No depression, no anxiety, no agitation.  Gastrointestinal: Abdominal tenderness, minimal right cvat., obese. No mass, no rigidity.   Musculoskeletal: Normal gait and station of head and neck.     Complexity of Data:  Lab Test Review:   BMP, CBC with Diff  Records Review:   Previous Hospital Records, Previous Patient Records  Urine Test Review:   Urinalysis, Urine Culture  X-Ray Review: C.T. Stone Protocol: Reviewed Films. Reviewed Report. Discussed With Patient.     PROCEDURES:          Urinalysis w/Scope Dipstick Dipstick Cont'd Micro  Color: Yellow Bilirubin: Neg mg/dL WBC/hpf: 6 - 10/XNA  Appearance: Clear Ketones: Neg mg/dL RBC/hpf: 3 - 35/TDD  Specific Gravity: 1.015 Blood: 2+ ery/uL Bacteria: Rare (0-9/hpf)  pH: 6.5 Protein: Neg mg/dL Cystals: NS (Not Seen)  Glucose: Neg mg/dL Urobilinogen: 0.2 mg/dL Casts: NS (Not Seen)    Nitrites: Neg Trichomonas: Not Present    Leukocyte Esterase: 1+ leu/uL Mucous: Not Present      Epithelial Cells: 0 - 5/hpf      Yeast: NS (Not Seen)      Sperm: Not Present    ASSESSMENT:      ICD-10 Details  1 GU:   Renal calculus - N20.0 Chronic, Worsening - Cheyenne Riley has multiple right renal stones with recent symptoms and had some hydro that was felt to possibly be a UPJ obstruction but prior imaging has not shown that and it is possible it could be from an intermittently obstructing stone in the renal pelvis. I am going to get a renogram to  assess for the UPJ obstruction and if that is found, we will need to consider a pyeloplasty with a nephrolithotomy, but if the UPJ in not found to be obstructed she would prefer an attempt at ESWL which she has had successfully in the past. I reviewed the risks of the procedure including the higher than usual probability of needing a second procedure, stent or ureteroscopy. I reviewed the risks of ESWL including bleeding, infection, injury to the kidney or adjacent structures, failure to fragment the stone, need for ancillary procedures, thrombotic events, cardiac arrhythmias and sedation complications. I will wait for the renogram before scheduling ESWL.   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    PLAN:            Medications  Refill Meds: Potassium Citrate Er 10 meq (1,080 mg) tablet, extended release 1 tablet PO TID   #270  3 Refill(s)            Orders         Schedule Labs: 1 Week - BMP    1 Month - BMP  X-Rays: Lasix Renogram - ASAP to r/o UPJ obstruction prior to ESWL.   Return Visit/Planned Activity: Next Available Appointment - Schedule Surgery             Note: I will get her set up for ESWL if she doesn't have a UPJ obstruction.

## 2020-05-17 NOTE — Interval H&P Note (Signed)
History and Physical Interval Note: No change in stones.   05/17/2020 7:36 AM  Cheyenne Riley  has presented today for surgery, with the diagnosis of RIGHT RENAL STONE.  The various methods of treatment have been discussed with the patient and family. After consideration of risks, benefits and other options for treatment, the patient has consented to  Procedure(s): RIGHT EXTRACORPOREAL SHOCK WAVE LITHOTRIPSY (ESWL) (Right) as a surgical intervention.  The patient's history has been reviewed, patient examined, no change in status, stable for surgery.  I have reviewed the patient's chart and labs.  Questions were answered to the patient's satisfaction.     Bjorn Pippin

## 2020-05-18 ENCOUNTER — Encounter (HOSPITAL_BASED_OUTPATIENT_CLINIC_OR_DEPARTMENT_OTHER): Payer: Self-pay | Admitting: Urology

## 2020-06-29 ENCOUNTER — Other Ambulatory Visit: Payer: Self-pay | Admitting: Urology

## 2020-07-03 ENCOUNTER — Other Ambulatory Visit: Payer: Self-pay

## 2020-07-03 ENCOUNTER — Emergency Department (HOSPITAL_BASED_OUTPATIENT_CLINIC_OR_DEPARTMENT_OTHER)
Admission: EM | Admit: 2020-07-03 | Discharge: 2020-07-03 | Disposition: A | Discharge status: Home / Self Care | Payer: BC Managed Care – PPO | Attending: Emergency Medicine | Admitting: Emergency Medicine

## 2020-07-03 DIAGNOSIS — R000 Tachycardia, unspecified: Secondary | ICD-10-CM | POA: Diagnosis present

## 2020-07-03 DIAGNOSIS — I471 Supraventricular tachycardia: Secondary | ICD-10-CM | POA: Insufficient documentation

## 2020-07-03 DIAGNOSIS — Z91040 Latex allergy status: Secondary | ICD-10-CM | POA: Diagnosis not present

## 2020-07-03 DIAGNOSIS — R079 Chest pain, unspecified: Secondary | ICD-10-CM | POA: Diagnosis not present

## 2020-07-03 DIAGNOSIS — F17200 Nicotine dependence, unspecified, uncomplicated: Secondary | ICD-10-CM | POA: Diagnosis not present

## 2020-07-03 NOTE — ED Triage Notes (Signed)
Reports hx of SVT and feels like she is back in it now.  First episode was around 3 but reports she converted then started back 15 min pta.  C/O headache and pain in upper chest above collar bone on both sides.

## 2020-07-03 NOTE — ED Notes (Signed)
ED Provider at bedside. 

## 2020-07-03 NOTE — ED Notes (Signed)
Pt on cardiac monitor, continuous pulse ox and auto VS 

## 2020-07-03 NOTE — ED Provider Notes (Signed)
[facility] EMERGENCY DEPARTMENT Provider Note   CSN: [id number] Arrival date & time: 07/03/20  1651     History Chief Complaint  Patient presents with  . Tachycardia    Cheyenne Riley is a 49 y.o. female.  Patient is a 49 year old female who has a history of SVT who presents with palpitations. She said that she has had several episodes of SVT in the past. She has previously seen a cardiologist but none in the last 5 to 7 years because she has not had too many problems with it. Today she had an episode of SVT that lasted about 15 minutes. She was still having palpitations when she presented to the ED. She had some associated discomfort around her collarbone during the episode. She denies any dizziness. No shortness of breath. When she got back to her room in the ED, her symptoms resolved. She has no further chest discomfort. She is overall been feeling well. No recent fevers or other illnesses. No leg swelling. No cough or cold symptoms. The last episode she had was about a year ago.        Past Medical History:  Diagnosis Date  . History of kidney stones   . Palpitation   . Renal stone 06/17/2013   50mm left renal stone that has grown.  She also has small right renal stones.   . SVT (supraventricular tachycardia) [facility])     Patient Active Problem List   Diagnosis Date Noted  . Renal stone 06/17/2013    Past Surgical History:  Procedure Laterality Date  . EXTRACORPOREAL SHOCK WAVE LITHOTRIPSY Right 05/17/2020   Procedure: RIGHT EXTRACORPOREAL SHOCK WAVE LITHOTRIPSY (ESWL);  Surgeon: Bjorn Pippin, MD;  Location: [facility];  Service: Urology;  Laterality: Right;  . NEPHROLITHOTOMY Left 06/17/2013   Procedure: NEPHROLITHOTOMY PERCUTANEOUS;  Surgeon: Bjorn Pippin, MD;  Location: [facility];  Service: Urology;  Laterality: Left;  . TONSILLECTOMY  1978     OB History   No obstetric history on file.     No family history on file.  Social History    Tobacco Use  . Smoking status: Current Every Day Smoker    Packs/day: 1.00  . Smokeless tobacco: Never Used  Substance Use Topics  . Alcohol use: No  . Drug use: No    Home Medications Prior to Admission medications   Medication Sig Start Date End Date Taking? Authorizing Provider  cholecalciferol (VITAMIN D3) 25 MCG (1000 UNIT) tablet Take 1,000 Units by mouth daily.    [provider]  ibuprofen (ADVIL,MOTRIN) 200 MG tablet Take 200 mg by mouth every 6 (six) hours as needed for pain.    [provider]  losartan (COZAAR) 50 MG tablet Take 50 mg by mouth daily.    [provider]  oxyCODONE-acetaminophen (PERCOCET/ROXICET) 5-325 MG tablet Take 1 tablet by mouth every 4 (four) hours as needed. 05/17/20   Bjorn Pippin, MD  potassium citrate (UROCIT-K) 10 MEQ (1080 MG) SR tablet Take 10 mEq by mouth 3 (three) times daily with meals.    [provider]    Allergies    Banana and Latex  Review of Systems   Review of Systems  Constitutional: Negative for chills, diaphoresis, fatigue and fever.  HENT: Negative for congestion, rhinorrhea and sneezing.   Eyes: Negative.   Respiratory: Positive for chest tightness. Negative for cough and shortness of breath.   Cardiovascular: Positive for palpitations. Negative for chest pain and leg swelling.  Gastrointestinal: Negative  for abdominal pain, blood in stool, diarrhea, nausea and vomiting.  Genitourinary: Negative for difficulty urinating, flank pain, frequency and hematuria.  Musculoskeletal: Negative for arthralgias and back pain.  Skin: Negative for rash.  Neurological: Negative for dizziness, speech difficulty, weakness, numbness and headaches.    Physical Exam Updated Vital Signs BP 113/71   Pulse 88   Temp 97.6 F (36.4 C) (Oral)   Resp 16   Ht 5\' 3"  (1.6 m)   Wt 113.4 kg   SpO2 96%   BMI 44.29 kg/m   Physical Exam Constitutional:      Appearance: She is well-developed.  HENT:      Head: Normocephalic and atraumatic.  Eyes:     Pupils: Pupils are equal, round, and reactive to light.  Cardiovascular:     Rate and Rhythm: Normal rate and regular rhythm.     Heart sounds: Normal heart sounds.  Pulmonary:     Effort: Pulmonary effort is normal. No respiratory distress.     Breath sounds: Normal breath sounds. No wheezing or rales.  Chest:     Chest wall: No tenderness.  Abdominal:     General: Bowel sounds are normal.     Palpations: Abdomen is soft.     Tenderness: There is no abdominal tenderness. There is no guarding or rebound.  Musculoskeletal:        General: Normal range of motion.     Cervical back: Normal range of motion and neck supple.     Comments: No edema or calf tenderness  Lymphadenopathy:     Cervical: No cervical adenopathy.  Skin:    General: Skin is warm and dry.     Findings: No rash.  Neurological:     Mental Status: She is alert and oriented to person, place, and time.     ED Results / Procedures / Treatments   Labs (all labs ordered are listed, but only abnormal results are displayed) Labs Reviewed - No data to display  EKG EKG Interpretation  Date/Time:  Saturday July 03 2020 17:26:38 EDT Ventricular Rate:  82 PR Interval:    QRS Duration: 88 QT Interval:  389 QTC Calculation: 455 R Axis:   12 Text Interpretation: Sinus rhythm Borderline T abnormalities, anterior leads similar to EKG from 2014 Confirmed by 09-30-1996 [phone]) on 07/03/2020 5:37:16 PM   Radiology No results found.  Procedures Procedures (including critical care time)  Medications Ordered in ED Medications - No data to display  ED Course  I have reviewed the triage vital signs and the nursing notes.  Pertinent labs & imaging results that were available during my care of the patient were reviewed by me and considered in my medical decision making (see chart for details).    MDM Rules/Calculators/A&P                          Patient is a  49 year old female who presents with SVT.  It resolved spontaneously in the ED.  On my evaluation she was asymptomatic.  She had some brief chest pain during episode of SVT but none since then.  Her repeat EKG shows no ischemic changes.  She was discharged home in good condition.  She was given a referral to follow-up with cardiology.  Return precautions were given. Final Clinical Impression(s) / ED Diagnoses Final diagnoses:  SVT (supraventricular tachycardia) (HCC)    Rx / DC Orders ED Discharge Orders    None  Rolan Bucco, MD 07/03/20 1740

## 2020-07-15 ENCOUNTER — Other Ambulatory Visit (HOSPITAL_COMMUNITY)
Admission: RE | Admit: 2020-07-15 | Discharge: 2020-07-15 | Disposition: A | Discharge status: Home / Self Care | Payer: BC Managed Care – PPO | Source: Ambulatory Visit | Attending: Urology | Admitting: Urology

## 2020-07-15 DIAGNOSIS — Z01812 Encounter for preprocedural laboratory examination: Secondary | ICD-10-CM | POA: Diagnosis not present

## 2020-07-15 DIAGNOSIS — Z20822 Contact with and (suspected) exposure to covid-19: Secondary | ICD-10-CM | POA: Insufficient documentation

## 2020-07-15 LAB — SARS CORONAVIRUS 2 (TAT 6-24 HRS): SARS Coronavirus 2: NEGATIVE

## 2020-07-15 NOTE — Progress Notes (Signed)
Pre op phone call completed.  Pt is aware of instructions and to arrive at 0600.

## 2020-07-16 NOTE — H&P (Signed)
Office Visit Report     06/21/2020   --------------------------------------------------------------------------------   Cheyenne Riley  MRN: [id number]  DOB: 02-28-1971, 49 year old Female  SSN: [id number]   PRIMARY CARE:    REFERRING:    PROVIDER:  Bjorn Pippin, M.D.  TREATING:  Vianne Bulls, NP  LOCATION:  [facility] [phone]     --------------------------------------------------------------------------------   CC: I have kidney stones.  HPI: Cheyenne Riley is a 49 year-old female established patient who is here for renal calculi.  06/21/20: Patient with below noted hx. She was noted have distal ureteral fragments on the right on prior KUB. She returns today for ongoing evaluation. She states that she did passed multiple additional small fragments since prior office visit. She denies any current pain in her right flank her abdomen. She does continue to complain of some mild, intermittent pain on the left. She denies any current dysuria or gross hematuria. No complaints of exacerbation of voiding symptoms. She denies fever, chills, nausea, or vomiting. She is interested in treatment of left renal calculus.   05/31/20: Patient with below noted hx. She is s/p right ESWL on 9/20. She returns today for follow up. She states that she has passed multiple stone fragments since her procedure with most recent passage of stone material being one week ago approximatly. She denies any current right sided flank pain. She is having some mild, intermittent, dull pain on the left. She denies any gross hematuria, dysuria, or exacerbation of voiding symptoms. She denies fevers, chills, nausea, or vomiting. She feels that she has been voiding normal volumes. She remains on potassium citrate.   July 2021: Cheyenne Riley was seen in the ER on 6/7 for a right ureteral stone. She feels she passed the stone prior to the CT but was also found to have several right renal stones with 55mm and 57mm stones  in the renal pelvis and a 57mm cluster in the RLP. There was a question of a mild UPJ obstruction as well. She has had no hematuria and has no significant flank pain but she can have some pain with increased activity and she improves when she lies on her stomach. She had a left PCNL several years ago and was last seen in 2015 by [place]. She has had one prior ESWL and would prefer that option over surgical procedures.   The problem is on both sides.     ALLERGIES: Latex No Allergies    MEDICATIONS: Losartan Potassium  Potassium Citrate Er 10 meq (1,080 mg) tablet, extended release 1 tablet PO TID  Vitamin D3     GU PSH: ESWL, Right - 05/17/2020, 2012 Percut Stone Removal >2cm - 2014       PSH Notes: Percutaneous Lithotomy For Stone Over 2cm., Lithotripsy, Hysteroscopy With Endometrial Ablation, Tonsillectomy With Adenoidectomy   NON-GU PSH: Hysteroscopy; Ablation - 2011 Remove Tonsils And Adenoids - 2011     GU PMH: Renal calculus - 05/31/2020, Cheyenne Riley has multiple right renal stones with recent symptoms and had some hydro that was felt to possibly be a UPJ obstruction but prior imaging has not shown that and it is possible it could be from an intermittently obstructing stone in the renal pelvis. I am going to get a renogram to assess for the UPJ obstruction and if that is found, we will need to consider a pyeloplasty with a nephrolithotomy, but if the UPJ in not found to be obstructed she would prefer an attempt at ESWL which  she has had successfully in the past. I reviewed the risks of the procedure including the higher than usual probability of needing a second procedure, stent or ureteroscopy. I reviewed the risks of ESWL including bleeding, infection, injury to the kidney or adjacent structures, failure to fragment the stone, need for ancillary procedures, thrombotic events, cardiac arrhythmias and sedation complications. I will wait for the renogram before scheduling ESWL. , - 03/15/2020,  Bilateral kidney stones, - 2015 Hydronephrosis - 03/15/2020 History of urolithiasis, Nephrolithiasis - 2014 Renal cyst, Renal cyst, acquired - 2014 Ureteral calculus, Ureteral Stone - 2014    NON-GU PMH: Encounter for general adult medical examination without abnormal findings, Encounter for preventive health examination - 2015 Renal leak hypercalciuria, Nephrocalcinosis - 2014 Personal history of other diseases of the nervous system and sense organs, History of migraine headaches - 2014 Arrhythmia Hypertension    FAMILY HISTORY: Atherosclerosis - Mother Family Health Status - Father alive at age 25 - Mother Family Health Status - Mother's Age - Mother Family Health Status Number - Mother Heart Disease - Mother nephrolithiasis - Father   SOCIAL HISTORY: Marital Status: Married Current Smoking Status: Patient does not smoke anymore. Has not smoked since 02/26/2015. Smoked for 30 years.   Tobacco Use Assessment Completed: Used Tobacco in last 30 days? Light Drinker.  Drinks 2 caffeinated drinks per day. Patient's occupation Economist.     Notes: Current every day smoker, Tobacco Use, Occupation:, Marital History - Currently Married, Alcohol Use, Caffeine Use 4-6 soda per day   REVIEW OF SYSTEMS:    GU Review Female:   Patient denies frequent urination, hard to postpone urination, burning /pain with urination, get up at night to urinate, leakage of urine, stream starts and stops, trouble starting your stream, have to strain to urinate, and being pregnant.  Gastrointestinal (Upper):   Patient denies nausea, indigestion/ heartburn, and vomiting.  Gastrointestinal (Lower):   Patient denies diarrhea and constipation.  Constitutional:   Patient denies fever, night sweats, weight loss, and fatigue.  Skin:   Patient denies skin rash/ lesion and itching.  Eyes:   Patient denies blurred vision and double vision.  Ears/ Nose/ Throat:   Patient denies sore throat and sinus  problems.  Hematologic/Lymphatic:   Patient denies swollen glands and easy bruising.  Cardiovascular:   Patient denies leg swelling and chest pains.  Respiratory:   Patient denies cough and shortness of breath.  Endocrine:   Patient denies excessive thirst.  Musculoskeletal:   Patient denies back pain and joint pain.  Neurological:   Patient denies headaches and dizziness.  Psychologic:   Patient denies depression and anxiety.   VITAL SIGNS:      06/21/2020 10:26 AM  BP 128/81 mmHg  Pulse 69 /min  Temperature 97.3 F / 36.2 C   MULTI-SYSTEM PHYSICAL EXAMINATION:    Constitutional: Well-nourished. No physical deformities. Normally developed. Good grooming.  Respiratory: Normal breath sounds. No labored breathing, no use of accessory muscles.   Cardiovascular: Regular rate and rhythm. No murmur, no gallop. Normal temperature, normal extremity pulses, no swelling, no varicosities.   Skin: No paleness, no jaundice, no cyanosis. No lesion, no ulcer, no rash.  Neurologic / Psychiatric: Oriented to time, oriented to place, oriented to person. No depression, no anxiety, no agitation.  Musculoskeletal: Normal gait and station of head and neck.     Complexity of Data:  Records Review:   Previous Patient Records  Urine Test Review:   Urinalysis, Urine Culture  Urodynamics  Review:   Review Bladder Scan  X-Ray Review: KUB: Reviewed Films.  C.T. Abdomen/Pelvis: Reviewed Films. Reviewed Report.     PROCEDURES:         KUB - [id number]  A single view of the abdomen is obtained. Right renal shadow is not well visualized today due to overlying bowel gas pattern. Stable opacity noted within the confines of the left renal shadow. Along the expected anatomical course of the right ureter, it appears that previously noted Steinstrasse fragments has now resolved. There continues to be some areas of shadowing noted along the expected anatomical course of the right distal ureter, but I think these may be more  consistent with phleboliths. Multiple pelvic phleboliths, which appear grossly stable.      Patient confirmed No Neulasta OnPro Device.            Renal Ultrasound - [id number]  Right Kidney: Length: 11.4 cm Depth:5.0 cm Cortical Width: 0.97 cm Width: 6.3 cm  Left Kidney: Length: 11.6 cm Depth:5.0 cm Cortical Width: 0.93 cm Width:6.3 cm  Left Kidney/Ureter:  Fullness noted, ? Multiple stones, largest= 1.5cm MP.   Right Kidney/Ureter:  Mild hydro , ? Multiple stones, largest= 0.75cm LP.  Bladder:  Not seen.       Increased bowel gas.   Patient confirmed No Neulasta OnPro Device.           Urinalysis w/Scope Dipstick Dipstick Cont'd Micro  Color: Yellow Bilirubin: Neg mg/dL WBC/hpf: 0 - 5/hpf  Appearance: Clear Ketones: Neg mg/dL RBC/hpf: 20 - 67/TIW  Specific Gravity: 1.025 Blood: 3+ ery/uL Bacteria: Mod (26-50/hpf)  pH: 5.5 Protein: Trace mg/dL Cystals: NS (Not Seen)  Glucose: Neg mg/dL Urobilinogen: 0.2 mg/dL Casts: NS (Not Seen)    Nitrites: Neg Trichomonas: Not Present    Leukocyte Esterase: Trace leu/uL Mucous: Present      Epithelial Cells: 6 - 10/hpf      Yeast: NS (Not Seen)      Sperm: Not Present    ASSESSMENT:      ICD-10 Details  1 GU:   Renal calculus - N20.0   2   Hydronephrosis - N13.0    PLAN:           Orders Labs Urine Culture          Document Letter(s):  Created for Patient: Clinical Summary         Notes:   Precautionary culture was sent today. Overall, she continues to do well following right ESWL and has passed multiple fragments since previous office visit. On KUB imaging today, the right renal shadow is not well visualized today due to overlying bowel gas pattern. Stable opacity noted within the confines of the left renal shadow. Along the expected anatomical course of the right ureter, it appears that previously noted Steinstrasse fragments has now resolved. There continues to be some areas of shadowing noted along the expected anatomical  course of the right distal ureter, but I think these may be more consistent with phleboliths. Renal ultrasound does show some questionable mild right hydronephrosis, but this may be stable finding for her. She would like to consider moving forward with treatment of left renal calculus. Ultimately, I will review today's imaging study with her urologist for additional recommendations moving forward. I will keep her updated regarding those recommendations and follow-up we pending. General stone prevention strategies and dietary modifications discussed. Return precautions reviewed in the interim.    * Signed by Vianne Bulls, NP on 06/22/20 at 12:14 PM (  EDT)*

## 2020-07-19 ENCOUNTER — Other Ambulatory Visit: Payer: Self-pay

## 2020-07-19 ENCOUNTER — Encounter (HOSPITAL_BASED_OUTPATIENT_CLINIC_OR_DEPARTMENT_OTHER): Payer: Self-pay | Admitting: Urology

## 2020-07-19 ENCOUNTER — Encounter (HOSPITAL_BASED_OUTPATIENT_CLINIC_OR_DEPARTMENT_OTHER)
Admission: RE | Disposition: A | Discharge status: Home / Self Care | Payer: Self-pay | Source: Home / Self Care | Attending: Urology

## 2020-07-19 ENCOUNTER — Ambulatory Visit (HOSPITAL_BASED_OUTPATIENT_CLINIC_OR_DEPARTMENT_OTHER)
Admission: RE | Admit: 2020-07-19 | Discharge: 2020-07-19 | Disposition: A | Discharge status: Home / Self Care | Payer: BC Managed Care – PPO | Attending: Urology | Admitting: Urology

## 2020-07-19 ENCOUNTER — Ambulatory Visit (HOSPITAL_COMMUNITY): Payer: BC Managed Care – PPO

## 2020-07-19 DIAGNOSIS — N200 Calculus of kidney: Secondary | ICD-10-CM

## 2020-07-19 DIAGNOSIS — Z87891 Personal history of nicotine dependence: Secondary | ICD-10-CM | POA: Diagnosis not present

## 2020-07-19 DIAGNOSIS — Z841 Family history of disorders of kidney and ureter: Secondary | ICD-10-CM | POA: Insufficient documentation

## 2020-07-19 HISTORY — PX: EXTRACORPOREAL SHOCK WAVE LITHOTRIPSY: SHX1557

## 2020-07-19 LAB — POCT PREGNANCY, URINE: Preg Test, Ur: NEGATIVE

## 2020-07-19 SURGERY — LITHOTRIPSY, ESWL
Anesthesia: LOCAL | Laterality: Left

## 2020-07-19 MED ORDER — DIPHENHYDRAMINE HCL 25 MG PO CAPS
ORAL_CAPSULE | ORAL | Status: AC
  Filled 2020-07-19: qty 1

## 2020-07-19 MED ORDER — DIAZEPAM 5 MG PO TABS
ORAL_TABLET | ORAL | Status: AC
  Filled 2020-07-19: qty 2

## 2020-07-19 MED ORDER — SODIUM CHLORIDE 0.9 % IV SOLN: INTRAVENOUS | Status: DC

## 2020-07-19 MED ORDER — DIAZEPAM 5 MG PO TABS
10.0000 mg | ORAL_TABLET | ORAL | Status: AC
  Administered 2020-07-19: 10 mg via ORAL

## 2020-07-19 MED ORDER — CIPROFLOXACIN HCL 500 MG PO TABS
500.0000 mg | ORAL_TABLET | ORAL | Status: AC
  Administered 2020-07-19: 500 mg via ORAL

## 2020-07-19 MED ORDER — CIPROFLOXACIN HCL 500 MG PO TABS
ORAL_TABLET | ORAL | Status: AC
  Filled 2020-07-19: qty 1

## 2020-07-19 MED ORDER — TAMSULOSIN HCL 0.4 MG PO CAPS: 0.4000 mg | ORAL_CAPSULE | Freq: Every day | ORAL | 0 refills | Status: DC

## 2020-07-19 MED ORDER — DIPHENHYDRAMINE HCL 25 MG PO CAPS
25.0000 mg | ORAL_CAPSULE | ORAL | Status: AC
  Administered 2020-07-19: 25 mg via ORAL

## 2020-07-19 NOTE — Interval H&P Note (Signed)
History and Physical Interval Note:  07/19/2020 6:51 AM  Cheyenne Riley  has presented today for surgery, with the diagnosis of LEFT RENAL STONE.  The various methods of treatment have been discussed with the patient and family. After consideration of risks, benefits and other options for treatment, the patient has consented to  Procedure(s): LEFT EXTRACORPOREAL SHOCK WAVE LITHOTRIPSY (ESWL) (Left) as a surgical intervention.  The patient's history has been reviewed, patient examined, no change in status, stable for surgery.  I have reviewed the patient's chart and labs.  Questions were answered to the patient's satisfaction.     [employer]

## 2020-07-19 NOTE — Discharge Instructions (Addendum)
Lithotripsy, Care After This sheet gives you information about how to care for yourself after your procedure. Your health care provider may also give you more specific instructions. If you have problems or questions, contact your health care provider. What can I expect after the procedure? After the procedure, it is common to have:  Some blood in your urine. This should only last for a few days.  Soreness in your back, sides, or upper abdomen for a few days.  Blotches or bruises on your back where the pressure wave entered the skin.  Pain, discomfort, or nausea when pieces (fragments) of the kidney [place] move through the tube that carries urine from the kidney to the bladder (ureter). Wulf fragments may pass soon after the procedure, but they may continue to pass for up to 4-8 weeks. ? If you have severe pain or nausea, contact your health care provider. This may be caused by a large Bad[place] that was not broken up, and this may mean that you need more treatment.  Some pain or discomfort during urination.  Some pain or discomfort in the lower abdomen or (in men) at the base of the penis. Follow these instructions at home: Medicines  Take over-the-counter and prescription medicines only as told by your health care provider.  If you were prescribed an antibiotic medicine, take it as told by your health care provider. Do not stop taking the antibiotic even if you start to feel better.  Do not drive for 24 hours if you were given a medicine to help you relax (sedative).  Do not drive or use heavy machinery while taking prescription pain medicine. Eating and drinking      Drink enough water and fluids to keep your urine clear or pale yellow. This helps any remaining pieces of the [place] to pass. It can also help prevent new stones from forming.  Eat plenty of fresh fruits and vegetables.  Follow instructions from your health care provider about eating and drinking restrictions. You may be  instructed: ? To reduce how much salt (sodium) you eat or drink. Check ingredients and nutrition facts on packaged foods and beverages. ? To reduce how much meat you eat.  Eat the recommended amount of calcium for your age and gender. Ask your health care provider how much calcium you should have. General instructions  Get plenty of rest.  Most people can resume normal activities 1-2 days after the procedure. Ask your health care provider what activities are safe for you.  Your health care provider may direct you to lie in a certain position (postural drainage) and tap firmly (percuss) over your kidney area to help [place] fragments pass. Follow instructions as told by your health care provider.  If directed, strain all urine through the strainer that was provided by your health care provider. ? Keep all fragments for your health care provider to see. Any stones that are found may be sent to a medical lab for examination. The [place] may be as small as a grain of salt.  Keep all follow-up visits as told by your health care provider. This is important. Contact a health care provider if:  You have pain that is severe or does not get better with medicine.  You have nausea that is severe or does not go away.  You have blood in your urine longer than your health care provider told you to expect.  You have more blood in your urine.  You have pain during urination that does   not go away.  You urinate more frequently than usual and this does not go away.  You develop a rash or any other possible signs of an allergic reaction. Get help right away if:  You have severe pain in your back, sides, or upper abdomen.  You have severe pain while urinating.  Your urine is very dark red.  You have blood in your stool (feces).  You cannot pass any urine at all.  You feel a strong urge to urinate after emptying your bladder.  You have a fever or chills.  You develop shortness of breath,  difficulty breathing, or chest pain.  You have severe nausea that leads to persistent vomiting.  You faint. Summary  After this procedure, it is common to have some pain, discomfort, or nausea when pieces (fragments) of the kidney stone move through the tube that carries urine from the kidney to the bladder (ureter). If this pain or nausea is severe, however, you should contact your health care provider.  Most people can resume normal activities 1-2 days after the procedure. Ask your health care provider what activities are safe for you.  Drink enough water and fluids to keep your urine clear or pale yellow. This helps any remaining pieces of the stone to pass, and it can help prevent new stones from forming.  If directed, strain your urine and keep all fragments for your health care provider to see. Fragments or stones may be as small as a grain of salt.  Get help right away if you have severe pain in your back, sides, or upper abdomen or have severe pain while urinating. This information is not intended to replace advice given to you by your health care provider. Make sure you discuss any questions you have with your health care provider. Document Revised: 11/25/2018 Document Reviewed: 07/05/2016 Elsevier Patient Education  2020 [employer]. 1. You should strain your urine and collect all fragments and bring them to your follow up appointment.  2. You should take your pain medication as needed.  Please call if your pain is severe to the point that it is not controlled with your pain medication. 3. You should call if you develop fever > 101 or persistent nausea or vomiting. 4. Your doctor may prescribe tamsulosin to take to help facilitate stone passage.

## 2020-07-19 NOTE — Op Note (Signed)
See [facility] operative note scanned into chart. Also because of the size, density, location and other factors that cannot be anticipated I feel this will likely be a staged procedure. This fact supersedes any indication in the scanned [facility] operative note to the contrary.  

## 2020-07-20 ENCOUNTER — Encounter (HOSPITAL_BASED_OUTPATIENT_CLINIC_OR_DEPARTMENT_OTHER): Payer: Self-pay | Admitting: Urology

## 2020-10-10 ENCOUNTER — Emergency Department (HOSPITAL_BASED_OUTPATIENT_CLINIC_OR_DEPARTMENT_OTHER): Payer: BC Managed Care – PPO

## 2020-10-10 ENCOUNTER — Other Ambulatory Visit: Payer: Self-pay

## 2020-10-10 ENCOUNTER — Emergency Department (HOSPITAL_BASED_OUTPATIENT_CLINIC_OR_DEPARTMENT_OTHER)
Admission: EM | Admit: 2020-10-10 | Discharge: 2020-10-10 | Disposition: A | Discharge status: Home / Self Care | Payer: BC Managed Care – PPO | Attending: Emergency Medicine | Admitting: Emergency Medicine

## 2020-10-10 ENCOUNTER — Encounter (HOSPITAL_BASED_OUTPATIENT_CLINIC_OR_DEPARTMENT_OTHER): Payer: Self-pay | Admitting: Obstetrics and Gynecology

## 2020-10-10 DIAGNOSIS — N201 Calculus of ureter: Secondary | ICD-10-CM

## 2020-10-10 DIAGNOSIS — Z91040 Latex allergy status: Secondary | ICD-10-CM | POA: Insufficient documentation

## 2020-10-10 DIAGNOSIS — R1032 Left lower quadrant pain: Secondary | ICD-10-CM | POA: Diagnosis present

## 2020-10-10 DIAGNOSIS — Z87891 Personal history of nicotine dependence: Secondary | ICD-10-CM | POA: Diagnosis not present

## 2020-10-10 DIAGNOSIS — Z87442 Personal history of urinary calculi: Secondary | ICD-10-CM | POA: Diagnosis not present

## 2020-10-10 LAB — CBC WITH DIFFERENTIAL/PLATELET
Abs Immature Granulocytes: 0.02 10*3/uL (ref 0.00–0.07)
Basophils Absolute: 0.1 10*3/uL (ref 0.0–0.1)
Basophils Relative: 1 %
Eosinophils Absolute: 0.2 10*3/uL (ref 0.0–0.5)
Eosinophils Relative: 3 %
HCT: 44.2 % (ref 36.0–46.0)
Hemoglobin: 14.3 g/dL (ref 12.0–15.0)
Immature Granulocytes: 0 %
Lymphocytes Relative: 32 %
Lymphs Abs: 2.1 10*3/uL (ref 0.7–4.0)
MCH: 28.7 pg (ref 26.0–34.0)
MCHC: 32.4 g/dL (ref 30.0–36.0)
MCV: 88.8 fL (ref 80.0–100.0)
Monocytes Absolute: 0.5 10*3/uL (ref 0.1–1.0)
Monocytes Relative: 8 %
Neutro Abs: 3.7 10*3/uL (ref 1.7–7.7)
Neutrophils Relative %: 56 %
Platelets: 293 10*3/uL (ref 150–400)
RBC: 4.98 MIL/uL (ref 3.87–5.11)
RDW: 13.5 % (ref 11.5–15.5)
WBC: 6.6 10*3/uL (ref 4.0–10.5)
nRBC: 0 % (ref 0.0–0.2)

## 2020-10-10 LAB — COMPREHENSIVE METABOLIC PANEL
ALT: 24 U/L (ref 0–44)
AST: 24 U/L (ref 15–41)
Albumin: 4 g/dL (ref 3.5–5.0)
Alkaline Phosphatase: 83 U/L (ref 38–126)
Anion gap: 10 (ref 5–15)
BUN: 16 mg/dL (ref 6–20)
CO2: 24 mmol/L (ref 22–32)
Calcium: 9.2 mg/dL (ref 8.9–10.3)
Chloride: 105 mmol/L (ref 98–111)
Creatinine, Ser: 1.04 mg/dL — ABNORMAL HIGH (ref 0.44–1.00)
GFR, Estimated: >60 mL/min (ref >60)
Glucose, Bld: 107 mg/dL — ABNORMAL HIGH (ref 70–99)
Potassium: 4.3 mmol/L (ref 3.5–5.1)
Sodium: 139 mmol/L (ref 135–145)
Total Bilirubin: 0.5 mg/dL (ref 0.3–1.2)
Total Protein: 7.1 g/dL (ref 6.5–8.1)

## 2020-10-10 LAB — PREGNANCY, URINE: Preg Test, Ur: NEGATIVE

## 2020-10-10 LAB — URINALYSIS, ROUTINE W REFLEX MICROSCOPIC
Bilirubin Urine: NEGATIVE
Glucose, UA: NEGATIVE mg/dL
Ketones, ur: NEGATIVE mg/dL
Leukocytes,Ua: NEGATIVE
Nitrite: NEGATIVE
Protein, ur: NEGATIVE mg/dL
Specific Gravity, Urine: 1.015 (ref 1.005–1.030)
pH: 6 (ref 5.0–8.0)

## 2020-10-10 LAB — URINALYSIS, MICROSCOPIC (REFLEX)

## 2020-10-10 MED ORDER — KETOROLAC TROMETHAMINE 30 MG/ML IJ SOLN
30.0000 mg | Freq: Once | INTRAMUSCULAR | Status: AC
  Administered 2020-10-10: 30 mg via INTRAVENOUS
  Filled 2020-10-10: qty 1

## 2020-10-10 MED ORDER — TAMSULOSIN HCL 0.4 MG PO CAPS: 0.4000 mg | ORAL_CAPSULE | Freq: Every day | ORAL | 0 refills | Status: AC

## 2020-10-10 MED ORDER — HYDROCODONE-ACETAMINOPHEN 5-325 MG PO TABS: 1.0000 | ORAL_TABLET | ORAL | 0 refills | Status: DC | PRN

## 2020-10-10 MED ORDER — SODIUM CHLORIDE 0.9 % IV BOLUS
1000.0000 mL | Freq: Once | INTRAVENOUS | Status: AC
  Administered 2020-10-10: 1000 mL via INTRAVENOUS

## 2020-10-10 MED ORDER — ONDANSETRON HCL 4 MG/2ML IJ SOLN
4.0000 mg | Freq: Once | INTRAMUSCULAR | Status: AC
  Administered 2020-10-10: 4 mg via INTRAVENOUS
  Filled 2020-10-10: qty 2

## 2020-10-10 NOTE — ED Provider Notes (Signed)
[facility] EMERGENCY DEPARTMENT Provider Note   CSN: [id number] Arrival date & time: 10/10/20  [id number]     History Chief Complaint  Patient presents with  . Flank Pain    Left     Cheyenne Riley is a 50 y.o. female.  HPI     50yo female with history of nephrolithiasis presents with concern for left flank pain. Began Friday, was dull at first and waxing and waning since this time.  Severe at times, moderate at this time. Tried percocet had left over from prior lithotripsy with temporary relief.  Had nausea, minimal emesis. A few episodes of diarrhea. No fever, no dysuria, no other symptoms.  Left flank with radiation towards LLQ. Similar to prior kidney stones.  Has seen Dr. Annabell HowellsWrenn.  Past Medical History:  Diagnosis Date  . History of kidney stones   . Palpitation   . Renal stone 06/17/2013   22mm left renal stone that has grown.  She also has small right renal stones.   . SVT (supraventricular tachycardia) [facility](HCC)     Patient Active Problem List   Diagnosis Date Noted  . Renal stone 06/17/2013    Past Surgical History:  Procedure Laterality Date  . EXTRACORPOREAL SHOCK WAVE LITHOTRIPSY Right 05/17/2020   Procedure: RIGHT EXTRACORPOREAL SHOCK WAVE LITHOTRIPSY (ESWL);  Surgeon: Bjorn PippinWrenn, John, MD;  [facility];  Service: Urology;  Laterality: Right;  . EXTRACORPOREAL SHOCK WAVE LITHOTRIPSY Left 07/19/2020   Procedure: LEFT EXTRACORPOREAL SHOCK WAVE LITHOTRIPSY (ESWL);  Surgeon: Heloise PurpuraBorden, Lester, MD;  [facility] [place];  Service: Urology;  Laterality: Left;  . NEPHROLITHOTOMY Left 06/17/2013   Procedure: NEPHROLITHOTOMY PERCUTANEOUS;  Surgeon: Bjorn PippinJohn Wrenn, MD;  Location: [facility];  Service: Urology;  Laterality: Left;  . TONSILLECTOMY  1978     OB History    Gravida      Para      Term      Preterm      AB      Living        SAB      IAB      Ectopic      Multiple      Live Births  4           No  family history on file.  Social History   Tobacco Use  . Smoking status: Former Smoker    Packs/day: 1.00  . Smokeless tobacco: Never Used  . Tobacco comment: Using Vape since 2016  Vaping Use  . Vaping Use: Every day  . Substances: Nicotine, Flavoring  Substance Use Topics  . Alcohol use: Yes    Comment: Social  . Drug use: No    Home Medications Prior to Admission medications   Medication Sig Start Date End Date Taking? Authorizing Provider  cholecalciferol (VITAMIN D3) 25 MCG (1000 UNIT) tablet Take 1,000 Units by mouth daily.   Yes [provider]  HYDROcodone-acetaminophen (NORCO/VICODIN) 5-325 MG tablet Take 1 tablet by mouth every 4 (four) hours as needed. 10/10/20  Yes Alvira MondaySchlossman, Maezie Justin, MD  losartan (COZAAR) 50 MG tablet Take 50 mg by mouth daily.   Yes [provider]  oxyCODONE-acetaminophen (PERCOCET/ROXICET) 5-325 MG tablet Take 1 tablet by mouth every 4 (four) hours as needed. 05/17/20  Yes Bjorn PippinWrenn, John, MD  potassium citrate (UROCIT-K) 10 MEQ (1080 MG) SR tablet Take 10 mEq by mouth 3 (three) times daily with meals.   Yes [provider]  tamsulosin (FLOMAX) 0.4 MG  CAPS capsule Take 1 capsule (0.4 mg total) by mouth daily for 14 days. 10/10/20 10/24/20 Yes Alvira Monday, MD    Allergies    Banana and Latex  Review of Systems   Review of Systems  Constitutional: Negative for fever.  HENT: Negative for sore throat.   Eyes: Negative for visual disturbance.  Respiratory: Negative for cough and shortness of breath.   Cardiovascular: Negative for chest pain.  Gastrointestinal: Positive for abdominal pain, diarrhea and nausea. Negative for vomiting.  Genitourinary: Positive for flank pain. Negative for difficulty urinating.  Musculoskeletal: Negative for back pain and neck pain.  Skin: Negative for rash.  Neurological: Negative for syncope and headaches.    Physical Exam Updated Vital Signs BP 115/62 (BP Location: Left Arm) Comment:  Changed b/p cuff to larger size  Pulse (!) 50   Temp 97.9 F (36.6 C) (Oral)   Resp 16   Ht 5\' 3"  (1.6 m)   Wt 109.8 kg   SpO2 99%   BMI 42.87 kg/m   Physical Exam Vitals and nursing note reviewed.  Constitutional:      General: She is not in acute distress.    Appearance: She is well-developed and well-nourished. She is not diaphoretic.  HENT:     Head: Normocephalic and atraumatic.  Eyes:     Extraocular Movements: EOM normal.     Conjunctiva/sclera: Conjunctivae normal.  Cardiovascular:     Rate and Rhythm: Normal rate and regular rhythm.     Pulses: Intact distal pulses.     Heart sounds: Normal heart sounds. No murmur heard. No friction rub. No gallop.   Pulmonary:     Effort: Pulmonary effort is normal. No respiratory distress.     Breath sounds: Normal breath sounds. No wheezing or rales.  Abdominal:     General: There is no distension.     Palpations: Abdomen is soft.     Tenderness: There is no abdominal tenderness. There is no guarding.  Musculoskeletal:        General: No tenderness or edema.     Cervical back: Normal range of motion.  Skin:    General: Skin is warm and dry.     Findings: No erythema or rash.  Neurological:     Mental Status: She is alert and oriented to person, place, and time.     ED Results / Procedures / Treatments   Labs (all labs ordered are listed, but only abnormal results are displayed) Labs Reviewed  URINALYSIS, ROUTINE W REFLEX MICROSCOPIC - Abnormal; Notable for the following components:      Result Value   Color, Urine ORANGE (*)    Hgb urine dipstick LARGE (*)    All other components within normal limits  COMPREHENSIVE METABOLIC PANEL - Abnormal; Notable for the following components:   Glucose, Bld 107 (*)    Creatinine, Ser 1.04 (*)    All other components within normal limits  URINALYSIS, MICROSCOPIC (REFLEX) - Abnormal; Notable for the following components:   Bacteria, UA FEW (*)    All other components within  normal limits  PREGNANCY, URINE  CBC WITH DIFFERENTIAL/PLATELET    EKG None  Radiology CT Renal Stone Study  Result Date: 10/10/2020 CLINICAL DATA:  Left flank pain and urinary urgency EXAM: CT ABDOMEN AND PELVIS WITHOUT CONTRAST TECHNIQUE: Multidetector CT imaging of the abdomen and pelvis was performed following the standard protocol without IV contrast. COMPARISON:  February 02, 2020 FINDINGS: Lower chest: There is mild bibasilar atelectatic change. No  lung edema or consolidation evident. Hepatobiliary: There is hepatic steatosis. No focal liver lesions are appreciable on this noncontrast enhanced study. Gallbladder wall is not appreciably thickened. There is no biliary duct dilatation. Pancreas: There is no pancreatic mass or inflammatory focus. Spleen: No splenic lesions are evident. Adrenals/Urinary Tract: Adrenals bilaterally appear normal. There is no appreciable renal mass on either side. There is moderate hydronephrosis on the left. There is an apparent mildly prominent extrarenal pelvis on the right. No apparent hydronephrosis on the right. There is slight nephrocalcinosis on each side. On the right, there is a well-defined 2 mm calculus in the lower pole the right kidney and a 1 mm calculus in the upper pole the left kidney. Larger calculi noted previously in the right kidney are no longer evident. There is no right ureteral calculus. On the left, there is a focal calculus in the upper to mid kidney region measuring 7 x 4 mm. There are several scattered 1 mm calculi in the mid to lower left kidney. There is a focal calculus at the left ureterovesical junction measuring 5 x 4 mm. No other ureteral calculus is evident. Note that there is dilatation of the left ureter throughout its course. Urinary bladder is midline with wall thickness within normal limits. Note that there are phleboliths near but separate from the distal ureters. Stomach/Bowel: There is no appreciable bowel wall or mesenteric  thickening. Terminal ileum appears normal. There is no evident bowel obstruction. There is no free air or portal venous air. Vascular/Lymphatic: There is no abdominal aortic aneurysm. There is minimal aortic vascular calcification. There are stable subcentimeter right abdominal mesenteric lymph nodes. There is no adenopathy by size criteria in the abdomen or pelvis. Reproductive: The uterus is anteverted. There is no evident adnexal mass. Other: Appendix appears normal. There is no evident abscess or adenopathy in the abdomen or pelvis. There is a fairly small umbilical hernia containing fat but no bowel, stable in appearance. Musculoskeletal: There are no blastic or lytic bone lesions. No intramuscular lesions are evident. IMPRESSION: 1. 5 x 4 mm calculus at the left ureterovesical junction causing moderate left-sided hydronephrosis and ureterectasis. 2. Intrarenal calculi bilaterally, with largest intrarenal calculus noted on the left measuring 7 x 4 mm. Mild nephrocalcinosis noted bilaterally. 3.  Hepatic steatosis. 4. No bowel wall thickening or bowel obstruction. No abscess in the abdomen or pelvis. Appendix appears normal. 5.  Stable umbilical hernia containing fat but no bowel. Electronically Signed   By: Bretta Bang III M.D.   On: 10/10/2020 11:14    Procedures Procedures   Medications Ordered in ED Medications  sodium chloride 0.9 % bolus 1,000 mL (0 mLs Intravenous Stopped 10/10/20 1147)  ketorolac (TORADOL) 30 MG/ML injection 30 mg (30 mg Intravenous Given 10/10/20 1028)  ondansetron (ZOFRAN) injection 4 mg (4 mg Intravenous Given 10/10/20 1027)    ED Course  I have reviewed the triage vital signs and the nursing notes.  Pertinent labs & imaging results that were available during my care of the patient were reviewed by me and considered in my medical decision making (see chart for details).    MDM Rules/Calculators/A&P                          50yo female with history of  nephrolithiasis presents with concern for left flank pain.  NO sign of UTI. Pregnancy test negative. Labs without significant abnormalities.   CT shows 5x105mm stone at UVJ.  Given IV fluids, toradol with improvement in pain. Reviewed in [place] drug database and discussed risks of narcotic rx and gave rx for norco and recommend ibuprofen as well for pain. Patient discharged in stable condition with understanding of reasons to return.   Final Clinical Impression(s) / ED Diagnoses Final diagnoses:  Ureterolithiasis    Rx / DC Orders ED Discharge Orders         Ordered    HYDROcodone-acetaminophen (NORCO/VICODIN) 5-325 MG tablet  Every 4 hours PRN        10/10/20 1132    tamsulosin (FLOMAX) 0.4 MG CAPS capsule  Daily        10/10/20 1133           Alvira Monday, MD 10/10/20 2343

## 2020-10-10 NOTE — ED Notes (Signed)
Patient ambulatory to restroom without assistance. 

## 2020-10-10 NOTE — ED Triage Notes (Signed)
Patient reports left sided flank pain. Patient reports she has chronic kidney stones and is having pain in the left flank around to her abdomen and in her bladder. Patient denies blood in urine but reports taking OTC Azo so her urinae is orange

## 2020-10-10 NOTE — ED Notes (Signed)
Patient in CT at this time.

## 2021-10-17 IMAGING — CT CT RENAL STONE PROTOCOL
2 of 4 series · 15 of 46 positions shown, 17 images · non-contrast
Comparison: February 02, 2020

CLINICAL DATA: Left flank pain and urinary urgency

EXAM:
CT ABDOMEN AND PELVIS WITHOUT CONTRAST
TECHNIQUE: Multidetector CT imaging of the abdomen and pelvis was performed
following the standard protocol without IV contrast.

[Series 2: axial st · axial · 0.98mm/px · z∈[-416,-22]mm · 12 of 95 slices shown, 14 images]
[im 8/95  soft-tissue]
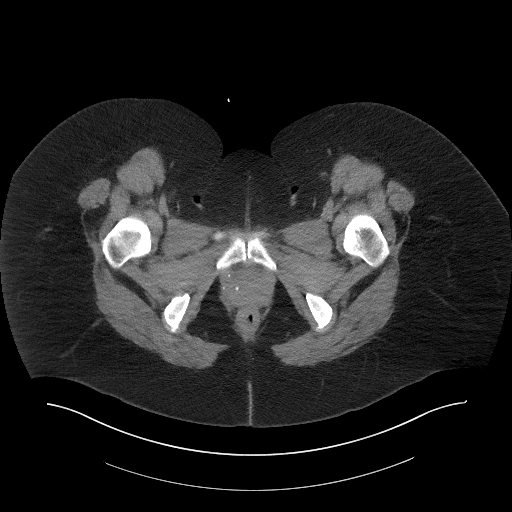
[im 8/95  bone]
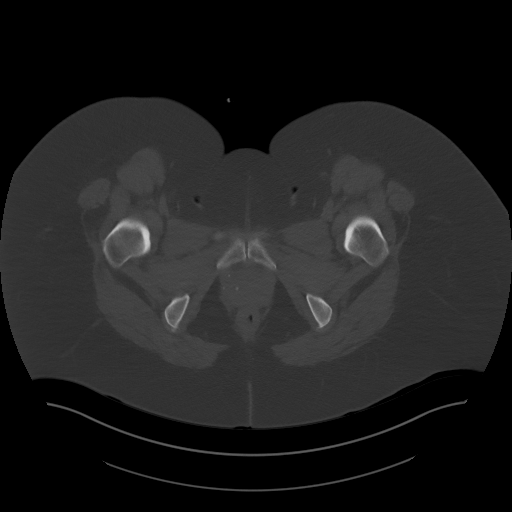
[im 15/95  soft-tissue]
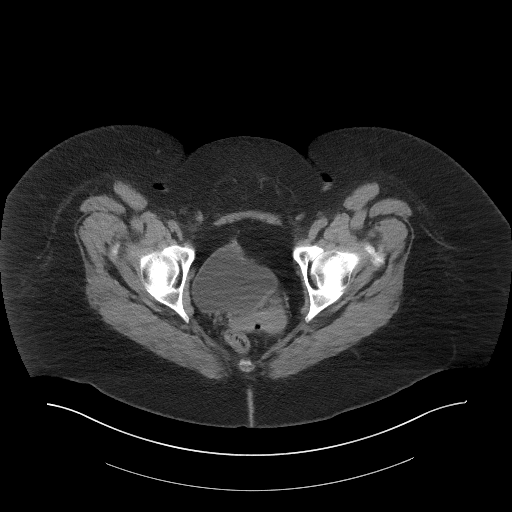
[im 22/95  soft-tissue]
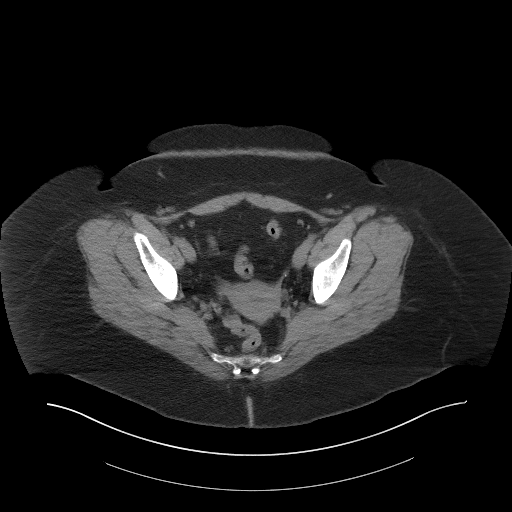
[im 29/95  soft-tissue]
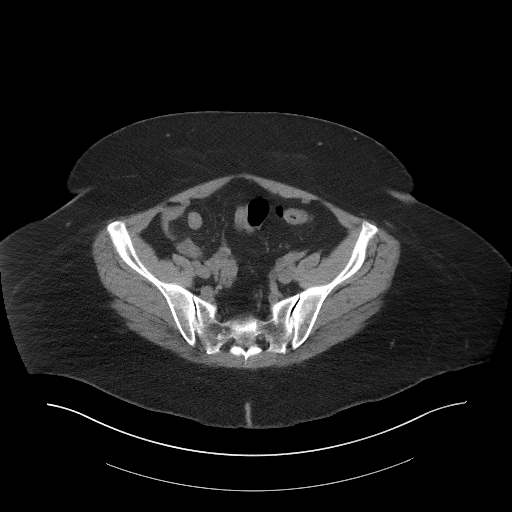
[im 37/95  soft-tissue]
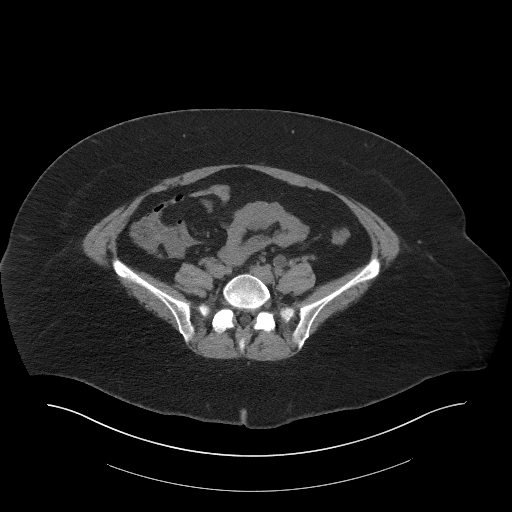
[im 44/95  soft-tissue]
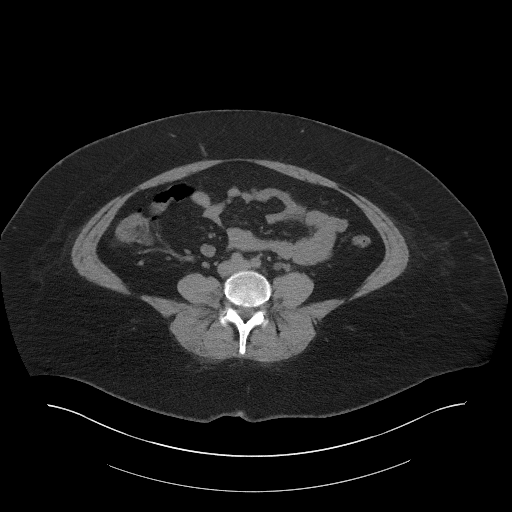
[im 51/95  soft-tissue]
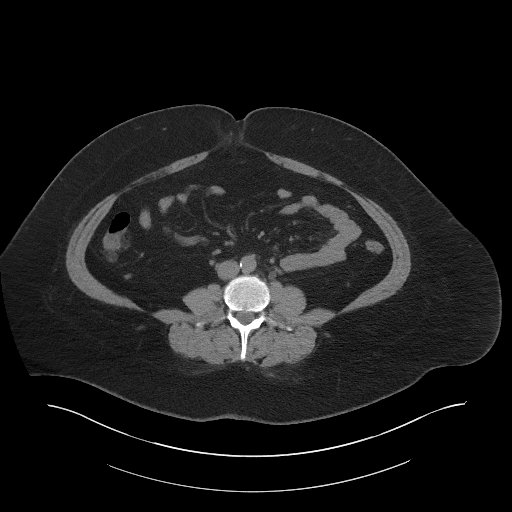
[im 58/95  soft-tissue]
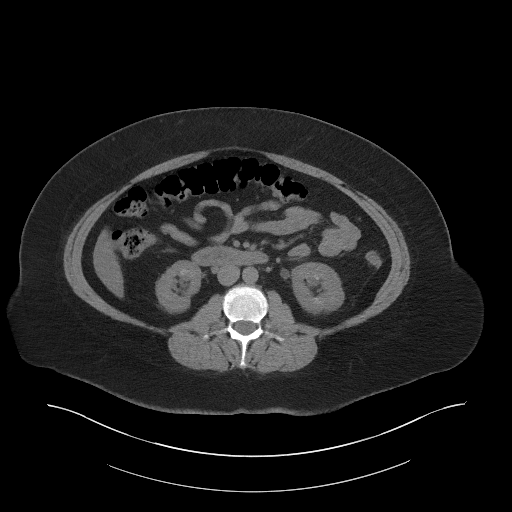
[im 66/95  soft-tissue]
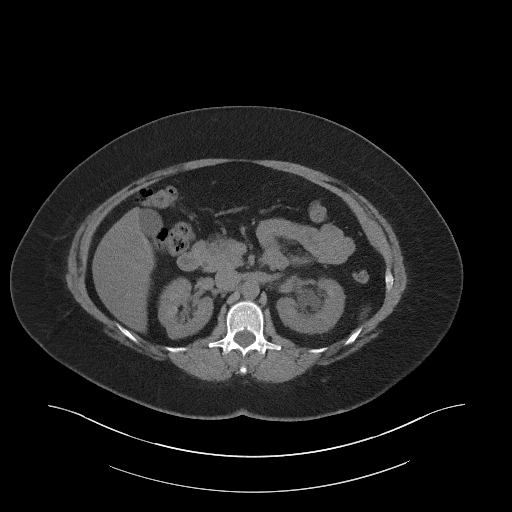
[im 66/95  bone]
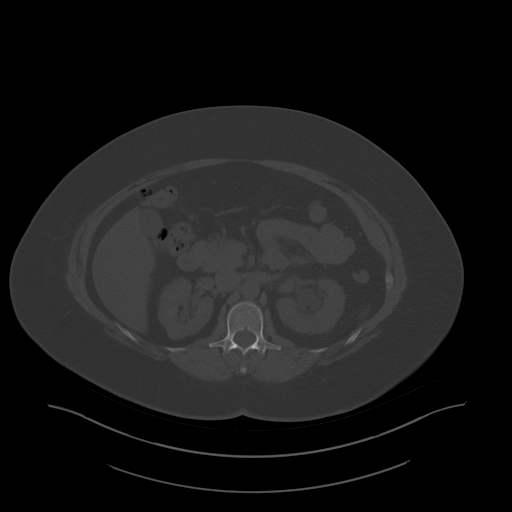
[im 73/95  soft-tissue]
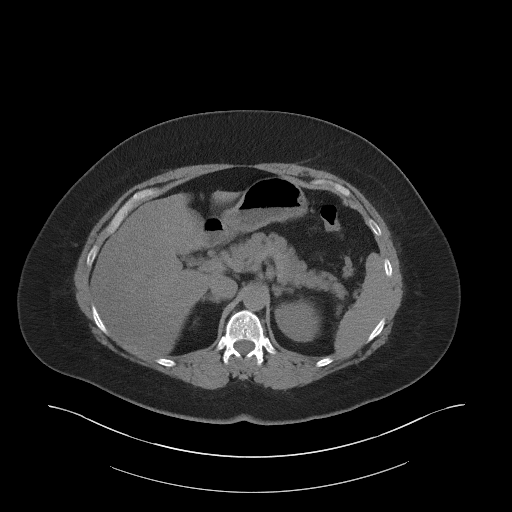
[im 80/95  soft-tissue]
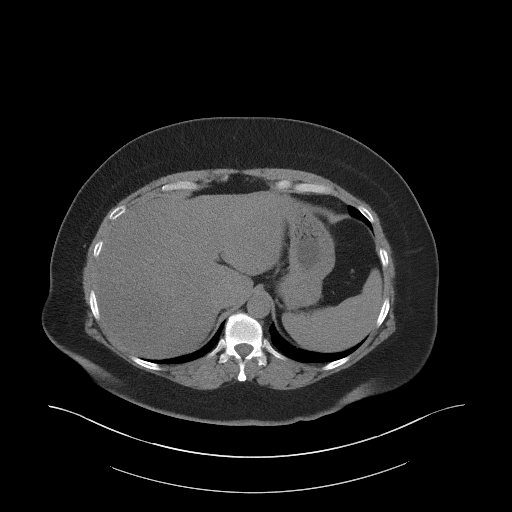
[im 87/95  soft-tissue]
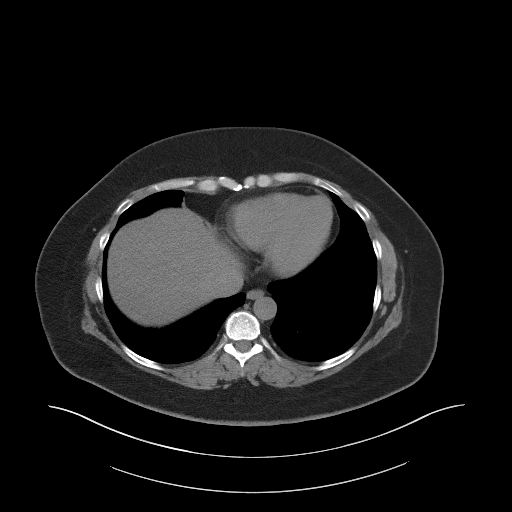

[Series 5: coronal st · coronal · 0.91mm/px · 3 of 103 slices shown]
[im 35/103  soft-tissue]
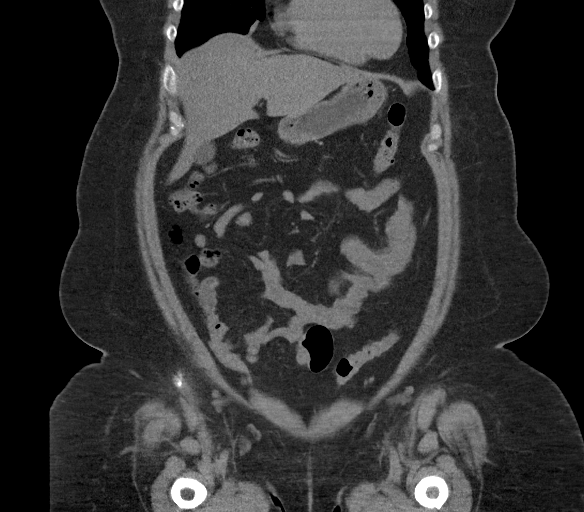
[im 46/103  soft-tissue]
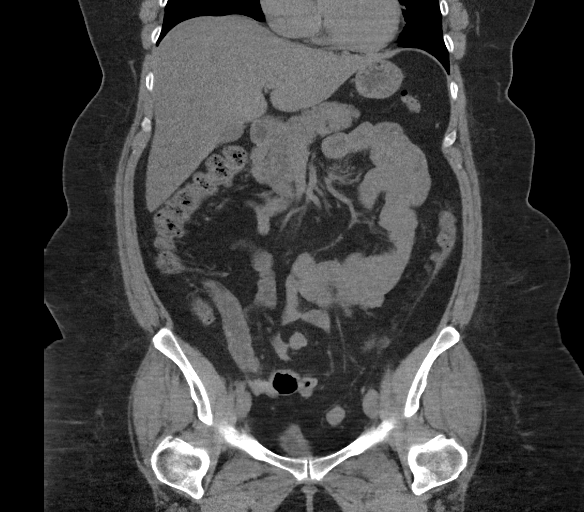
[im 57/103  soft-tissue]
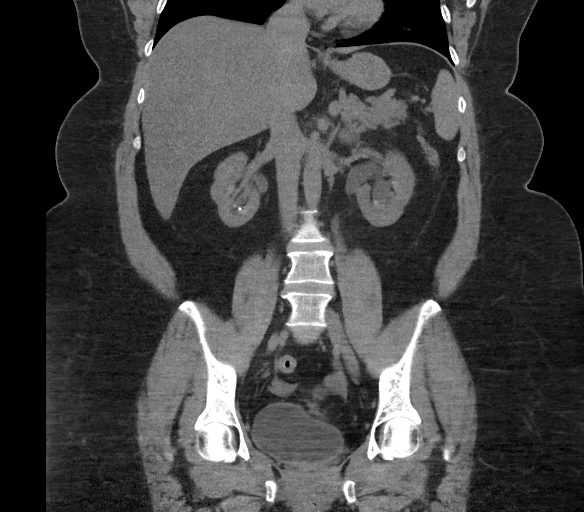

[15 of 46 positions shown; findings below may reference images not displayed]

FINDINGS: Lower chest: There is mild bibasilar atelectatic change. No lung
edema or consolidation evident.

Hepatobiliary: There is hepatic steatosis. No focal liver lesions
are appreciable on this noncontrast enhanced study. Gallbladder wall
is not appreciably thickened. There is no biliary duct dilatation.

Pancreas: There is no pancreatic mass or inflammatory focus.

Spleen: No splenic lesions are evident.

Adrenals/Urinary Tract: Adrenals bilaterally appear normal. There is
no appreciable renal mass on either side. There is moderate
hydronephrosis on the left. There is an apparent mildly prominent
extrarenal pelvis on the right. No apparent hydronephrosis on the
right. There is slight nephrocalcinosis on each side. On the right,
there is a well-defined 2 mm calculus in the lower pole the right
kidney and a 1 mm calculus in the upper pole the left kidney. Larger
calculi noted previously in the right kidney are no longer evident.
There is no right ureteral calculus. On the left, there is a focal
calculus in the upper to mid kidney region measuring 7 x 4 mm. There
are several scattered 1 mm calculi in the mid to lower left kidney.
There is a focal calculus at the left ureterovesical junction
measuring 5 x 4 mm. No other ureteral calculus is evident. Note that
there is dilatation of the left ureter throughout its course.
Urinary bladder is midline with wall thickness within normal limits.
Note that there are phleboliths near but separate from the distal
ureters.

Stomach/Bowel: There is no appreciable bowel wall or mesenteric
thickening. Terminal ileum appears normal. There is no evident bowel
obstruction. There is no free air or portal venous air.

Vascular/Lymphatic: There is no abdominal aortic aneurysm. There is
minimal aortic vascular calcification. There are stable
subcentimeter right abdominal mesenteric lymph nodes. There is no
adenopathy by size criteria in the abdomen or pelvis.

Reproductive: The uterus is anteverted. There is no evident adnexal
mass.

Other: Appendix appears normal. There is no evident abscess or
adenopathy in the abdomen or pelvis. There is a fairly small
umbilical hernia containing fat but no bowel, stable in appearance.

Musculoskeletal: There are no blastic or lytic bone lesions. No
intramuscular lesions are evident.
IMPRESSION: 1. 5 x 4 mm calculus at the left ureterovesical junction causing
moderate left-sided hydronephrosis and ureterectasis.

2. Intrarenal calculi bilaterally, with largest intrarenal calculus
noted on the left measuring 7 x 4 mm. Mild nephrocalcinosis noted
bilaterally.

3.  Hepatic steatosis.

4. No bowel wall thickening or bowel obstruction. No abscess in the
abdomen or pelvis. Appendix appears normal.

5.  Stable umbilical hernia containing fat but no bowel.

## 2022-07-17 ENCOUNTER — Other Ambulatory Visit (HOSPITAL_COMMUNITY): Payer: Self-pay

## 2022-07-17 MED ORDER — SAXENDA 18 MG/3ML ~~LOC~~ SOPN
PEN_INJECTOR | SUBCUTANEOUS | 2 refills | Status: AC
  Filled 2022-07-17 (×2): qty 15, 30d supply, fill #0
  Filled 2022-09-12: qty 15, 30d supply, fill #1

## 2022-09-14 ENCOUNTER — Other Ambulatory Visit: Payer: Self-pay

## 2022-09-14 ENCOUNTER — Other Ambulatory Visit (HOSPITAL_COMMUNITY): Payer: Self-pay

## 2022-09-21 ENCOUNTER — Other Ambulatory Visit (HOSPITAL_COMMUNITY): Payer: Self-pay

## 2022-09-21 MED ORDER — SAXENDA 18 MG/3ML ~~LOC~~ SOPN
3.0000 mg | PEN_INJECTOR | Freq: Every day | SUBCUTANEOUS | 0 refills | Status: DC
  Filled 2022-09-21 – 2022-10-26 (×2): qty 15, 30d supply, fill #0
  Filled 2023-01-05: qty 15, 30d supply, fill #1
  Filled 2023-04-30: qty 15, 30d supply, fill #2

## 2022-10-03 ENCOUNTER — Other Ambulatory Visit (HOSPITAL_COMMUNITY): Payer: Self-pay

## 2022-10-26 ENCOUNTER — Other Ambulatory Visit (HOSPITAL_COMMUNITY): Payer: Self-pay

## 2022-11-21 ENCOUNTER — Other Ambulatory Visit (HOSPITAL_COMMUNITY): Payer: Self-pay

## 2022-11-21 MED ORDER — SAXENDA 18 MG/3ML ~~LOC~~ SOPN
3.0000 mg | PEN_INJECTOR | Freq: Every day | SUBCUTANEOUS | 0 refills | Status: AC
  Filled 2022-11-21: qty 15, 30d supply, fill #0
  Filled 2023-02-05: qty 15, 30d supply, fill #1
  Filled 2023-03-07: qty 15, 30d supply, fill #2

## 2022-12-11 ENCOUNTER — Other Ambulatory Visit (HOSPITAL_COMMUNITY): Payer: Self-pay

## 2023-01-05 ENCOUNTER — Other Ambulatory Visit: Payer: Self-pay

## 2023-02-06 ENCOUNTER — Other Ambulatory Visit (HOSPITAL_COMMUNITY): Payer: Self-pay

## 2023-03-08 ENCOUNTER — Other Ambulatory Visit (HOSPITAL_COMMUNITY): Payer: Self-pay

## 2023-03-10 ENCOUNTER — Other Ambulatory Visit (HOSPITAL_COMMUNITY): Payer: Self-pay

## 2023-03-12 ENCOUNTER — Other Ambulatory Visit (HOSPITAL_COMMUNITY): Payer: Self-pay

## 2023-05-01 ENCOUNTER — Other Ambulatory Visit (HOSPITAL_COMMUNITY): Payer: Self-pay

## 2023-05-03 ENCOUNTER — Other Ambulatory Visit (HOSPITAL_COMMUNITY): Payer: Self-pay

## 2023-07-20 ENCOUNTER — Other Ambulatory Visit (HOSPITAL_COMMUNITY): Payer: Self-pay

## 2023-07-20 MED ORDER — SAXENDA 18 MG/3ML ~~LOC~~ SOPN
PEN_INJECTOR | SUBCUTANEOUS | 0 refills | Status: DC
  Filled 2023-07-20 (×2): qty 15, 30d supply, fill #0
  Filled 2023-08-25: qty 15, 30d supply, fill #1
  Filled 2023-10-04: qty 15, 30d supply, fill #2

## 2023-07-27 ENCOUNTER — Other Ambulatory Visit (HOSPITAL_COMMUNITY): Payer: Self-pay

## 2023-07-31 ENCOUNTER — Other Ambulatory Visit (HOSPITAL_COMMUNITY): Payer: Self-pay

## 2023-08-25 ENCOUNTER — Other Ambulatory Visit (HOSPITAL_COMMUNITY): Payer: Self-pay

## 2023-09-01 ENCOUNTER — Other Ambulatory Visit (HOSPITAL_COMMUNITY): Payer: Self-pay

## 2023-10-05 ENCOUNTER — Other Ambulatory Visit (HOSPITAL_COMMUNITY): Payer: Self-pay

## 2023-10-15 ENCOUNTER — Other Ambulatory Visit (HOSPITAL_COMMUNITY): Payer: Self-pay

## 2023-10-17 ENCOUNTER — Other Ambulatory Visit: Payer: Self-pay

## 2024-04-24 ENCOUNTER — Other Ambulatory Visit: Payer: Self-pay | Admitting: Urology

## 2024-04-29 ENCOUNTER — Encounter (HOSPITAL_COMMUNITY): Payer: Self-pay | Admitting: Urology

## 2024-04-29 NOTE — Progress Notes (Signed)
 Spoke w/ via phone for pre-op interview---[facility] needs dos----KUB and UPT           COVID test -----patient states asymptomatic no test needed Arrive at -------0600 NPO after MN NO Solid Food.  Clear liquids from MN until--- Med rec completed. Pt aware to hold ASA/NSAIDs and supplements per [facility] protocol.  Medications to take morning of surgery -----pain meds Diabetic/Weight loss medication -----on hold since last dose 04/26/2024 No Alcohol or recreational drugs for 24 hours/Tobacco products for 6 hours ---- Patient instructed to bring blue lithotripsy folder, photo id and insurance card day of surgery. Patient aware to have Driver (ride ) / caregiver for 24 hours after surgery -----Larry [phone] Patient Special Instructions -----wear comfortable clothes without metal belt buckles, buttons, or zippers; also no sandals, flip flops, crocs, or clogs.  Pre-Op special Instructions ----- take laxative of choice day before procedure Patient verbalized understanding of instructions that were given at this phone interview. Patient denies shortness of breath, chest pain, fever, cough at this phone interview.

## 2024-05-02 ENCOUNTER — Encounter (HOSPITAL_COMMUNITY): Payer: Self-pay | Admitting: Urology

## 2024-05-05 ENCOUNTER — Encounter (HOSPITAL_COMMUNITY)
Admission: RE | Disposition: A | Discharge status: Home / Self Care | Payer: Self-pay | Source: Home / Self Care | Attending: Urology

## 2024-05-05 ENCOUNTER — Ambulatory Visit (HOSPITAL_COMMUNITY)

## 2024-05-05 ENCOUNTER — Ambulatory Visit (HOSPITAL_COMMUNITY)
Admission: RE | Admit: 2024-05-05 | Discharge: 2024-05-05 | Disposition: A | Discharge status: Home / Self Care | Attending: Urology | Admitting: Urology

## 2024-05-05 ENCOUNTER — Other Ambulatory Visit: Payer: Self-pay

## 2024-05-05 ENCOUNTER — Encounter (HOSPITAL_COMMUNITY): Payer: Self-pay | Admitting: Urology

## 2024-05-05 DIAGNOSIS — N202 Calculus of kidney with calculus of ureter: Secondary | ICD-10-CM | POA: Insufficient documentation

## 2024-05-05 DIAGNOSIS — N200 Calculus of kidney: Secondary | ICD-10-CM | POA: Diagnosis present

## 2024-05-05 HISTORY — PX: EXTRACORPOREAL SHOCK WAVE LITHOTRIPSY: SHX1557

## 2024-05-05 HISTORY — DX: Cardiac arrhythmia, unspecified: I49.9

## 2024-05-05 SURGERY — LITHOTRIPSY, ESWL
Anesthesia: LOCAL | Laterality: Right

## 2024-05-05 MED ORDER — SODIUM CHLORIDE 0.9 % IV SOLN: INTRAVENOUS | Status: DC

## 2024-05-05 MED ORDER — DIPHENHYDRAMINE HCL 25 MG PO CAPS: 25.0000 mg | ORAL_CAPSULE | ORAL | Status: DC

## 2024-05-05 MED ORDER — CIPROFLOXACIN HCL 500 MG PO TABS
500.0000 mg | ORAL_TABLET | ORAL | Status: AC
  Administered 2024-05-05: 500 mg via ORAL
  Filled 2024-05-05: qty 1

## 2024-05-05 MED ORDER — OXYCODONE HCL 5 MG PO TABS: 5.0000 mg | ORAL_TABLET | ORAL | 0 refills | Status: DC | PRN

## 2024-05-05 MED ORDER — DIAZEPAM 5 MG PO TABS
10.0000 mg | ORAL_TABLET | ORAL | Status: AC
  Administered 2024-05-05: 10 mg via ORAL
  Filled 2024-05-05: qty 2

## 2024-05-05 MED ORDER — HYDROCODONE-ACETAMINOPHEN 5-325 MG PO TABS: 1.0000 | ORAL_TABLET | ORAL | 0 refills | Status: DC | PRN

## 2024-05-05 NOTE — Op Note (Signed)
 See [employer] OP note scanned into chart. Also because of the size, density, location and other factors that cannot be anticipated I feel this will likely be a staged procedure. This fact supersedes any indication in the scanned [place] stone operative note to the contrary.

## 2024-05-05 NOTE — H&P (Signed)
 CC: I have kidney stones.  HPI: Cheyenne Riley is a 53 year-old female patient who is here for renal calculi.  The problem is on both sides.   04/23/24: Cheyenne Riley returns today with concerns for recurrent stones. She has passed a couple of stones in the last 4 years with the last about a year ago. She is currently having intermittent right flank pain for a couple of months. She has some achiness on the left as well. She has no nausea with the pain and no hematuria. She has no frequency or urgency. Her pain is worse with activity. Her UA is clear.   08/02/20: Patient with below noted history. She is status post left ESWL on 11/22 for a left renal pelvic calculus. She states that she passed multiple stone fragments initially following her procedure, but has not passed any stone material recently. She denies any current flank or abdominal pain. She denies dysuria or gross hematuria. She currently feels she is voiding at baseline. No complaints of fever, chills, nausea, or vomiting.   06/21/20: Patient with below noted hx. She was noted have distal ureteral fragments on the right on prior KUB. She returns today for ongoing evaluation. She states that she did passed multiple additional small fragments since prior office visit. She denies any current pain in her right flank her abdomen. She does continue to complain of some mild, intermittent pain on the left. She denies any current dysuria or gross hematuria. No complaints of exacerbation of voiding symptoms. She denies fever, chills, nausea, or vomiting. She is interested in treatment of left renal calculus.   05/31/20: Patient with below noted hx. She is s/p right ESWL on 9/20. She returns today for follow up. She states that she has passed multiple stone fragments since her procedure with most recent passage of stone material being one week ago approximatly. She denies any current right sided flank pain. She is having some mild, intermittent, dull pain on the  left. She denies any gross hematuria, dysuria, or exacerbation of voiding symptoms. She denies fevers, chills, nausea, or vomiting. She feels that she has been voiding normal volumes. She remains on potassium citrate.   July 2021: Cheyenne Riley was seen in the ER on 6/7 for a right ureteral stone. She feels she passed the stone prior to the CT but was also found to have several right renal stones with 7mm and 9mm stones in the renal pelvis and a 20mm cluster in the RLP. There was a question of a mild UPJ obstruction as well. She has had no hematuria and has no significant flank pain but she can have some pain with increased activity and she improves when she lies on her stomach. She had a left PCNL several years ago and was last seen in 2015 by us . She has had one prior ESWL and would prefer that option over surgical procedures.     ALLERGIES: Banana Latex No Allergies    MEDICATIONS: dilTIAZem HCl  Estrogen  Linzess 145 MCG Capsule  Omega 3  Progesterone 100 MG Capsule  traZODone HCl 50 MG Tablet  Vitamin D3  Vitamin K  Wegovy     GU PSH: ESWL, Left - 2021, Right - 2021, 2012 Percut Stone Removal >2cm - 2014       PSH Notes: Percutaneous Lithotomy For Stone Over 2cm., Lithotripsy, Hysteroscopy With Endometrial Ablation, Tonsillectomy With Adenoidectomy, nephrolithomy, lithotripsy   NON-GU PSH: Hysteroscopy; Ablation - 2011 Remove Tonsils And Adenoids - 2011  GU PMH: Renal calculus - 2021, - 2021, - 2021, [name] has multiple right renal stones with recent symptoms and had some hydro that was felt to possibly be a UPJ obstruction but prior imaging has not shown that and it is possible it could be from an intermittently obstructing stone in the renal pelvis. I am going to get a renogram to assess for the UPJ obstruction and if that is found, we will need to consider a pyeloplasty with a nephrolithotomy, but if the UPJ in not found to be obstructed she would prefer an attempt at ESWL  which she has had successfully in the past. I reviewed the risks of the procedure including the higher than usual probability of needing a second procedure, stent or ureteroscopy. I reviewed the risks of ESWL including bleeding, infection, injury to the kidney or adjacent structures, failure to fragment the stone, need for ancillary procedures, thrombotic events, cardiac arrhythmias and sedation complications. I will wait for the renogram before scheduling ESWL. , - 2021, Bilateral kidney stones, - 2015 Hydronephrosis - 2021, - 2021 History of urolithiasis, Nephrolithiasis - 2014 Renal cyst, Renal cyst, acquired - 2014 Ureteral calculus, Ureteral Stone - 2014    NON-GU PMH: Encounter for general adult medical examination without abnormal findings, Encounter for preventive health examination - 2015 Renal leak hypercalciuria, Nephrocalcinosis - 2014 Personal history of other diseases of the nervous system and sense organs, History of migraine headaches - 2014 Arrhythmia Hypertension    FAMILY HISTORY: Atherosclerosis - Mother Death In The Family Mother - Other Family Health Status - Father alive at age 54 - Mother Family Health Status - Mother's Age - Mother Family Health Status Number - Mother Heart Disease - Mother nephrolithiasis - Father   SOCIAL HISTORY: Marital Status: Married Preferred Language: English; Ethnicity: Not Hispanic Or Latino; Race: White Current Smoking Status: Patient does not smoke anymore. Has not smoked since 02/26/2015. Smoked for 30 years.   Tobacco Use Assessment Completed: Used Tobacco in last 30 days? Does not use smokeless tobacco. Light Drinker.  Does not use drugs. Drinks 2 caffeinated drinks per day. Patient's occupation Economist.     Notes: Current every day smoker, Tobacco Use, Occupation:, Marital History - Currently Married, Alcohol Use, Caffeine Use 4-6 soda per day   REVIEW OF SYSTEMS:    GU Review Female:   Patient denies frequent  urination, hard to postpone urination, burning /pain with urination, get up at night to urinate, leakage of urine, stream starts and stops, trouble starting your stream, have to strain to urinate, and being pregnant.  Gastrointestinal (Upper):   Patient denies nausea, vomiting, and indigestion/ heartburn.  Gastrointestinal (Lower):   Patient reports constipation. Patient denies diarrhea.  Constitutional:   Patient denies fever, night sweats, weight loss, and fatigue.  Skin:   Patient denies skin rash/ lesion and itching.  Eyes:   Patient denies blurred vision and double vision.  Ears/ Nose/ Throat:   Patient denies sore throat and sinus problems.  Hematologic/Lymphatic:   Patient denies swollen glands and easy bruising.  Cardiovascular:   Patient denies leg swelling and chest pains.  Respiratory:   Patient denies cough and shortness of breath.  Endocrine:   Patient denies excessive thirst.  Musculoskeletal:   Patient denies back pain and joint pain.  Neurological:   Patient denies headaches and dizziness.  Psychologic:   Patient denies depression and anxiety.   VITAL SIGNS:      04/23/2024 08:33 AM  Weight 125 lb /  56.7 kg  Height 63 in / 160.02 cm  Temperature 97.5 F / 36.3 C  BMI 22.1 kg/m   MULTI-SYSTEM PHYSICAL EXAMINATION:    Constitutional: Well-nourished. No physical deformities. Normally developed. Good grooming.  Respiratory: Normal breath sounds. No labored breathing, no use of accessory muscles.   Cardiovascular: Regular rate and rhythm. No murmur, no gallop.   Skin: No paleness, no jaundice, no cyanosis. No lesion, no ulcer, no rash.  Neurologic / Psychiatric: Oriented to time, oriented to place, oriented to person. No depression, no anxiety, no agitation.  Gastrointestinal: No mass, no tenderness, no rigidity, non obese abdomen.   Musculoskeletal: Normal gait and station of head and neck.     Complexity of Data:  Records Review:   Previous Patient Records  Urine Test  Review:   Urinalysis  X-Ray Review: C.T. Stone Protocol: Reviewed Films. Discussed With Patient.     PROCEDURES:         C.T. Urogram - [id number]  enlarging right renal stones and stable left renal stone. See report when available and comments in assessment.       Patient confirmed No Neulasta OnPro Device.         Urinalysis w/Scope Dipstick Dipstick Cont'd Micro  Color: Yellow Bilirubin: Neg mg/dL WBC/hpf: 0 - 5/hpf  Appearance: Slightly Cloudy Ketones: Neg mg/dL RBC/hpf: 0 - 2/hpf  Specific Gravity: 1.020 Blood: 1+ ery/uL Bacteria: Few (10-25/hpf)  pH: 6.0 Protein: Trace mg/dL Cystals: NS (Not Seen)  Glucose: Neg mg/dL Urobilinogen: 0.2 mg/dL Casts: NS (Not Seen)    Nitrites: Neg Trichomonas: Not Present    Leukocyte Esterase: Trace leu/uL Mucous: Not Present      Epithelial Cells: NS (Not Seen)      Yeast: NS (Not Seen)      Sperm: Not Present    Notes: micro performed on unspun urine    ASSESSMENT:      ICD-10 Details  1 GU:   Renal calculus - N20.0 Bilateral, Chronic, Worsening - She has an 8mm right renal pelvic stone and a 12mm cluster of stones in the right lower pole. These have grown since her last CT. She has a stable left mid renal stone and bilateral nephrocalcinosis.   I discussed options and she would like to proceed with ESWL.   I reviewed the risks of ESWL including bleeding, infection, injury to the kidney or adjacent structures, failure to fragment the stone, need for ancillary procedures, thrombotic events, cardiac arrhythmias and sedation complications. She is aware that this may be a staged procedure.   She is off of the potassium citrate and would like an alternative. I will get her started on Litholyte. I will get a hypercalciuria profile today and she will need a litholink after she has completed treatment.     2   Flank Pain - R10.84 Chronic, Worsening   PLAN:           Orders Labs Hypercalciura Profile  X-Rays: C.T. Stone Protocol Without I.V.  Contrast  X-Ray Notes:   History:   Hematuria: Yes / No   Patient to see MD after exam: Yes/ No   Previous exam:   When:   Where:   Diabetic: Yes / No   BUN/ Creatinine:   Date of last BUN Creatinine:   Weight in pounds:   Allergy- IV Contrast: Yes/ No  Prior Authorization #: [employer] [place]: NPCR per Diana_F ref #[id number]           Schedule Return Visit/Planned Activity:  Next Available Appointment - Schedule Surgery  Procedure: Unspecified Date - ESWL - 50590, right Notes: next available.           Document Letter(s):  Created for Patient: Clinical Summary    Signed by Norleen Seltzer, M.D. on 04/23/24 at 1:32 PM (EDT

## 2024-05-06 ENCOUNTER — Encounter (HOSPITAL_COMMUNITY): Payer: Self-pay | Admitting: Urology

## 2024-05-26 ENCOUNTER — Other Ambulatory Visit: Payer: Self-pay | Admitting: Urology

## 2024-06-06 ENCOUNTER — Encounter (HOSPITAL_COMMUNITY): Payer: Self-pay | Admitting: Urology

## 2024-06-06 NOTE — Progress Notes (Signed)
 LITHO PREOP PHONE CALL   ALLERGIES REVIEWED: YES  MEDICATION REVIEW DONE: YES MEDICATIONS THAT PT SHOULD HOLD (LIST): Wegovy hold 1 week last dose 10/7  CAN PT WALK UP STAIRS WITHOUT SHORTNESS OF BREATH: YES HOME O2: NO CPAP: NO  IF YES, INFORMED PT TO BRING CPAP WITH TUBING AND MASK:YES/NO   INFORMED DRIVER NEEDED FOR PROCEDURE: YES   PT WAS GIVEN BLUE FOLDER AT UROLOGY APPT: YES PT INFORMED TO BRING BLUE FOLDER WITH ALL CONTENTS: YES  REVIEWED ARRIVAL TIME AND LOCATION: YES  OTHER PERTINENT INFORMATION:

## 2024-06-12 NOTE — H&P (Signed)
 Office Visit Report     05/21/2024   --------------------------------------------------------------------------------   Cheyenne Riley  MRN: [id number]  DOB: [birth year], 53 year old Female  SSN: [id number]   PRIMARY CARE:  Cheyenne Flatten, MD  PRIMARY CARE FAX:  [phone]  REFERRING:  Cheyenne Seltzer, MD  PROVIDER:  Norleen [facility], M.D.  TREATING:  Cheyenne Moats, NP  LOCATION:  [facility] [phone]     --------------------------------------------------------------------------------   CC: I have kidney stones.  HPI: Cheyenne Riley is a 53 year-old female established patient who is here for renal calculi.  05/21/2024: 53 year old female who presents today for follow-up of right-sided lithotripsy. She passed multiple fragments and brings it with her today. She is interested in pursuing right sided lithotripsy on the cluster stones she has on her right lower kidney. He has done this before in the past and it worked well. currently using litholyte and K citrate for management. She tolerates the Kcitrate poorly but will intermittently take one pill if she runs low on litholyte.   04/23/24: Cheyenne Riley returns today with concerns for recurrent stones. She has passed a couple of stones in the last 4 years with the last about a year ago. She is currently having intermittent right flank pain for a couple of months. She has some achiness on the left as well. She has no nausea with the pain and no hematuria. She has no frequency or urgency. Her pain is worse with activity. Her UA is clear.   08/02/20: Patient with below noted history. She is status post left ESWL on 11/22 for a left renal pelvic calculus. She states that she passed multiple stone fragments initially following her procedure, but has not passed any stone material recently. She denies any current flank or abdominal pain. She denies dysuria or gross hematuria. She currently feels she is voiding at baseline. No complaints of fever,  chills, nausea, or vomiting.   06/21/20: Patient with below noted hx. She was noted have distal ureteral fragments on the right on prior KUB. She returns today for ongoing evaluation. She states that she did passed multiple additional small fragments since prior office visit. She denies any current pain in her right flank her abdomen. She does continue to complain of some mild, intermittent pain on the left. She denies any current dysuria or gross hematuria. No complaints of exacerbation of voiding symptoms. She denies fever, chills, nausea, or vomiting. She is interested in treatment of left renal calculus.   05/31/20: Patient with below noted hx. She is s/p right ESWL on 9/20. She returns today for follow up. She states that she has passed multiple stone fragments since her procedure with most recent passage of stone material being one week ago approximatly. She denies any current right sided flank pain. She is having some mild, intermittent, dull pain on the left. She denies any gross hematuria, dysuria, or exacerbation of voiding symptoms. She denies fevers, chills, nausea, or vomiting. She feels that she has been voiding normal volumes. She remains on potassium citrate.   July 2021: Cheyenne Riley was seen in the ER on 6/7 for a right ureteral stone. She feels she passed the stone prior to the CT but was also found to have several right renal stones with 7mm and 9mm stones in the renal pelvis and a 20mm cluster in the RLP. There was a question of a mild UPJ obstruction as well. She has had no hematuria and has no significant flank pain but she can  have some pain with increased activity and she improves when she lies on her stomach. She had a left PCNL several years ago and was last seen in 2015 by us . She has had one prior ESWL and would prefer that option over surgical procedures.   The problem is on both sides.     ALLERGIES: Banana Latex No Allergies    MEDICATIONS: dilTIAZem HCl  Estrogen   Linzess 145 MCG Capsule  Omega 3  Progesterone 100 MG Capsule  traZODone HCl 50 MG Tablet  Vitamin D3  Vitamin K  Wegovy     GU PSH: ESWL, Left - 2021, Right - 2021, 2012 Percut Stone Removal >2cm - 2014       PSH Notes: Percutaneous Lithotomy For Stone Over 2cm., Lithotripsy, Hysteroscopy With Endometrial Ablation, Tonsillectomy With Adenoidectomy, nephrolithomy, lithotripsy   NON-GU PSH: Hysteroscopy; Ablation - 2011 Remove Tonsils And Adenoids - 2011     GU PMH: Flank Pain - 04/23/2024 Renal calculus, She has an 8mm right renal pelvic stone and a 12mm cluster of stones in the right lower pole. These have grown since her last CT. She has a stable left mid renal stone and bilateral nephrocalcinosis. I discussed options and she would like to proceed with ESWL. I reviewed the risks of ESWL including bleeding, infection, injury to the kidney or adjacent structures, failure to fragment the stone, need for ancillary procedures, thrombotic events, cardiac arrhythmias and sedation complications. She is aware that this may be a staged procedure. She is off of the potassium citrate and would like an alternative. I will get her started on Litholyte. I will get a hypercalciuria profile today and she will need a litholink after she has completed treatment. - 04/23/2024, - 2021, - 2021, - 2021, [name] has multiple right renal stones with recent symptoms and had some hydro that was felt to possibly be a UPJ obstruction but prior imaging has not shown that and it is possible it could be from an intermittently obstructing stone in the renal pelvis. I am going to get a renogram to assess for the UPJ obstruction and if that is found, we will need to consider a pyeloplasty with a nephrolithotomy, but if the UPJ in not found to be obstructed she would prefer an attempt at ESWL which she has had successfully in the past. I reviewed the risks of the procedure including the higher than usual probability of needing  a second procedure, stent or ureteroscopy. I reviewed the risks of ESWL including bleeding, infection, injury to the kidney or adjacent structures, failure to fragment the stone, need for ancillary procedures, thrombotic events, cardiac arrhythmias and sedation complications. I will wait for the renogram before scheduling ESWL. , - 2021, Bilateral kidney stones, - 2015 Hydronephrosis - 2021, - 2021 History of urolithiasis, Nephrolithiasis - 2014 Renal cyst, Renal cyst, acquired - 2014 Ureteral calculus, Ureteral Stone - 2014    NON-GU PMH: Encounter for general adult medical examination without abnormal findings, Encounter for preventive health examination - 2015 Renal leak hypercalciuria, Nephrocalcinosis - 2014 Personal history of other diseases of the nervous system and sense organs, History of migraine headaches - 2014 Arrhythmia Hypertension    FAMILY HISTORY: Atherosclerosis - Mother Death In The Family Mother - Other Family Health Status - Father alive at age 35 - Mother Family Health Status - Mother's Age - Mother Family Health Status Number - Mother Heart Disease - Mother nephrolithiasis - Father   SOCIAL HISTORY: Marital Status: Married Preferred Language: English;  Ethnicity: Not Hispanic Or Latino; Race: White Current Smoking Status: Patient does not smoke anymore. Has not smoked since 02/26/2015. Smoked for 30 years.   Tobacco Use Assessment Completed: Used Tobacco in last 30 days? Does not use smokeless tobacco. Light Drinker.  Does not use drugs. Drinks 2 caffeinated drinks per day. Patient's occupation Economist.     Notes: Current every day smoker, Tobacco Use, Occupation:, Marital History - Currently Married, Alcohol Use, Caffeine Use 4-6 soda per day   REVIEW OF SYSTEMS:    GU Review Female:   Patient denies being pregnant, stream starts and stops, burning /pain with urination, hard to postpone urination, leakage of urine, get up at night to urinate,  trouble starting your stream, frequent urination, and have to strain to urinate.  Gastrointestinal (Upper):   Patient denies nausea, vomiting, and indigestion/ heartburn.  Gastrointestinal (Lower):   Patient denies diarrhea and constipation.  Constitutional:   Patient denies fever, night sweats, weight loss, and fatigue.  Skin:   Patient denies skin rash/ lesion and itching.  Eyes:   Patient denies blurred vision and double vision.  Ears/ Nose/ Throat:   Patient denies sore throat and sinus problems.  Hematologic/Lymphatic:   Patient denies swollen glands and easy bruising.  Cardiovascular:   Patient denies leg swelling and chest pains.  Respiratory:   Patient denies cough and shortness of breath.  Endocrine:   Patient denies excessive thirst.  Musculoskeletal:   Patient denies back pain and joint pain.  Neurological:   Patient denies headaches and dizziness.  Psychologic:   Patient denies depression and anxiety.   VITAL SIGNS:      05/21/2024 08:23 AM  BP 106/66 mmHg  Pulse 65 /min  Temperature 97.7 F / 36.5 C   GU PHYSICAL EXAMINATION:    Breast: Symmetrical. No tenderness, no nipple discharge, no skin changes. No mass.  Digital Rectal Exam: Normal sphincter tone. No rectal mass.  External Genitalia: No hirsutism, no rash, no scarring, no cyst, no erythematous lesion, no papular lesion, no blanched lesion, no warty lesion. No edema.  Urethral Meatus: Normal size. Normal position. No discharge.  Urethra: No tenderness, no mass, no scarring. No hypermobility. No leakage.  Bladder: Normal to palpation, no tenderness, no mass, normal size.  Vagina: No atrophy, no stenosis. No rectocele. No cystocele. No enterocele.  Cervix: No inflammation, no discharge, no lesion, no tenderness, no wart.  Uterus: Normal size. Normal consistency. Normal position. No mobility. No descent.  Adnexa / Parametria: No tenderness. No adnexal mass. Normal left ovary. Normal right ovary.  Anus and Perineum: No  hemorrhoids. No anal stenosis. No rectal fissure, no anal fissure. No edema, no dimple, no perineal tenderness, no anal tenderness.   MULTI-SYSTEM PHYSICAL EXAMINATION:    Constitutional: Well-nourished. No physical deformities. Normally developed. Good grooming.  Neck: Neck symmetrical, not swollen. Normal tracheal position.  Respiratory: No labored breathing, no use of accessory muscles.   Cardiovascular: Normal temperature, normal extremity pulses, no swelling, no varicosities.  Lymphatic: No enlargement of neck, axillae, groin.  Skin: No paleness, no jaundice, no cyanosis. No lesion, no ulcer, no rash.  Neurologic / Psychiatric: Oriented to time, oriented to place, oriented to person. No depression, no anxiety, no agitation.  Gastrointestinal: No mass, no tenderness, no rigidity, non obese abdomen.  Eyes: Normal conjunctivae. Normal eyelids.  Ears, Nose, Mouth, and Throat: Left ear no scars, no lesions, no masses. Right ear no scars, no lesions, no masses. Nose no scars, no lesions, no  masses. Normal hearing. Normal lips.  Musculoskeletal: Normal gait and station of head and neck.     PAST DATA REVIEW: None   PROCEDURES:         KUB - 74018  A single view of the abdomen is obtained. There is a cluster of stones in her right lower pole measuring approximately 1.2 cm. there is a left side stone om the left lower pole measuring 5mm      Patient confirmed No Neulasta OnPro Device.           Visit Complexity - G2211          Urinalysis w/Scope Dipstick Dipstick Cont'd Micro  Color: Yellow Bilirubin: Neg mg/dL WBC/hpf: 6 - 89/yeq  Appearance: Clear Ketones: Neg mg/dL RBC/hpf: 0 - 2/hpf  Specific Gravity: 1.020 Blood: Trace ery/uL Bacteria: Few (10-25/hpf)  pH: 6.0 Protein: Trace mg/dL Cystals: NS (Not Seen)  Glucose: Neg mg/dL Urobilinogen: 0.2 mg/dL Casts: NS (Not Seen)    Nitrites: Neg Trichomonas: Not Present    Leukocyte Esterase: Trace leu/uL Mucous: Present      Epithelial  Cells: 0 - 5/hpf      Yeast: NS (Not Seen)      Sperm: Not Present    ASSESSMENT:      ICD-10 Details  1 GU:   Renal calculus - N20.0 Right, Chronic, Stable  2   Flank Pain - R10.84 Acute, Uncomplicated, Resolved   PLAN:            Medications New Meds: Potassium Citrate ER 15 MEQ (1620 MG) Tablet Extended Release 1 tablet PO Daily   #90  3 Refill(s)  Pharmacy Name:  [id number]  Address:  [address]   [place], [place] [address]  Phone:  [phone]  Fax:  [phone]            Orders Labs Stone Analysis          Schedule Return Visit/Planned Activity: Next Available Appointment - Schedule Surgery          Document Letter(s):  Created for Patient: Clinical Summary         Notes:   Will complete 24-hour urinalysis. For her right lower pole renal stone she would like to proceed with a right sided lithotripsy. She would like to get this completed toward the end of October. I will place a green sheet. She will follow-up at the end of October after lithotripsy and we can discuss her 24-hour urine results as well.    * Signed by Cheyenne Moats, NP on 05/21/24 at 3:04 PM (EDT)*      The information contained in this medical record document is considered private and confidential patient information. This information can only be used for the medical diagnosis and/or medical services that are being provided by the patient's selected caregivers. This information can only be distributed outside of the patient's care if the patient agrees and signs waivers of authorization for this information to be sent to an outside source or route.

## 2024-06-13 ENCOUNTER — Encounter (HOSPITAL_COMMUNITY)
Admission: RE | Disposition: A | Discharge status: Home / Self Care | Payer: Self-pay | Source: Home / Self Care | Attending: Urology

## 2024-06-13 ENCOUNTER — Encounter (HOSPITAL_COMMUNITY): Payer: Self-pay | Admitting: Urology

## 2024-06-13 ENCOUNTER — Other Ambulatory Visit: Payer: Self-pay

## 2024-06-13 ENCOUNTER — Ambulatory Visit (HOSPITAL_COMMUNITY)
Admission: RE | Admit: 2024-06-13 | Discharge: 2024-06-13 | Disposition: A | Discharge status: Home / Self Care | Attending: Urology | Admitting: Urology

## 2024-06-13 ENCOUNTER — Ambulatory Visit (HOSPITAL_COMMUNITY)

## 2024-06-13 DIAGNOSIS — N200 Calculus of kidney: Secondary | ICD-10-CM | POA: Insufficient documentation

## 2024-06-13 DIAGNOSIS — I499 Cardiac arrhythmia, unspecified: Secondary | ICD-10-CM | POA: Insufficient documentation

## 2024-06-13 DIAGNOSIS — I209 Angina pectoris, unspecified: Secondary | ICD-10-CM | POA: Diagnosis not present

## 2024-06-13 DIAGNOSIS — I10 Essential (primary) hypertension: Secondary | ICD-10-CM | POA: Insufficient documentation

## 2024-06-13 DIAGNOSIS — F17290 Nicotine dependence, other tobacco product, uncomplicated: Secondary | ICD-10-CM | POA: Diagnosis not present

## 2024-06-13 DIAGNOSIS — I471 Supraventricular tachycardia, unspecified: Secondary | ICD-10-CM | POA: Insufficient documentation

## 2024-06-13 HISTORY — PX: EXTRACORPOREAL SHOCK WAVE LITHOTRIPSY: SHX1557

## 2024-06-13 LAB — POCT PREGNANCY, URINE: Preg Test, Ur: NEGATIVE

## 2024-06-13 SURGERY — LITHOTRIPSY, ESWL
Anesthesia: LOCAL | Laterality: Right

## 2024-06-13 MED ORDER — DIAZEPAM 5 MG PO TABS
5.0000 mg | ORAL_TABLET | ORAL | Status: AC
  Administered 2024-06-13: 5 mg via ORAL

## 2024-06-13 MED ORDER — FAMOTIDINE 20 MG PO TABS
20.0000 mg | ORAL_TABLET | Freq: Every day | ORAL | Status: DC
  Administered 2024-06-13: 20 mg via ORAL
  Filled 2024-06-13: qty 1

## 2024-06-13 MED ORDER — DIPHENHYDRAMINE HCL 25 MG PO CAPS
25.0000 mg | ORAL_CAPSULE | ORAL | Status: AC
  Administered 2024-06-13: 25 mg via ORAL
  Filled 2024-06-13: qty 1

## 2024-06-13 MED ORDER — DIAZEPAM 5 MG PO TABS: 10.0000 mg | ORAL_TABLET | ORAL | Status: DC

## 2024-06-13 MED ORDER — DIAZEPAM 5 MG PO TABS
ORAL_TABLET | ORAL | Status: AC
  Filled 2024-06-13: qty 1

## 2024-06-13 MED ORDER — ONDANSETRON 8 MG PO TBDP: 8.0000 mg | ORAL_TABLET | Freq: Three times a day (TID) | ORAL | 0 refills | Status: AC | PRN

## 2024-06-13 MED ORDER — SODIUM CHLORIDE 0.9 % IV SOLN
INTRAVENOUS | Status: DC
  Administered 2024-06-13: 1000 mL via INTRAVENOUS

## 2024-06-13 MED ORDER — OXYCODONE-ACETAMINOPHEN 5-325 MG PO TABS: 1.0000 | ORAL_TABLET | ORAL | 0 refills | Status: AC | PRN

## 2024-06-13 MED ORDER — LEVOFLOXACIN 500 MG PO TABS
500.0000 mg | ORAL_TABLET | ORAL | Status: AC
  Administered 2024-06-13: 500 mg via ORAL
  Filled 2024-06-13: qty 1

## 2024-06-13 MED ORDER — TAMSULOSIN HCL 0.4 MG PO CAPS: 0.4000 mg | ORAL_CAPSULE | Freq: Every day | ORAL | 11 refills | Status: AC

## 2024-06-13 NOTE — Interval H&P Note (Signed)
 History and Physical Interval Note:  06/13/2024 9:02 AM  Cheyenne Riley  has presented today for surgery, with the diagnosis of RIGHT RENAL STONE.  The various methods of treatment have been discussed with the patient and family. After consideration of risks, benefits and other options for treatment, the patient has consented to  Procedure(s): LITHOTRIPSY, ESWL (Right) as a surgical intervention.  The patient's history has been reviewed, patient examined, no change in status, stable for surgery.  I have reviewed the patient's chart and labs.  Questions were answered to the patient's satisfaction.  She is well. No fever. Feeling well today. No dysuria.    Donnice Brooks

## 2024-06-13 NOTE — Op Note (Signed)
 Right renal stone (s)  Right ESWL  Findings: stone faded well. May need a staged procedure if she fails to pass the stone(s) and or fragements. She tolerated procedure well.

## 2024-06-13 NOTE — Progress Notes (Signed)
 Patient c/o itching on torso. No rash noted. 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 Order obtained to give pepcid. Before administering med, patient stated it was getting better. Now it's continuing to get better.

## 2024-06-13 NOTE — Interval H&P Note (Signed)
 History and Physical Interval Note:  06/13/2024 9:03 AM  Cheyenne Riley Avers  has presented today for surgery, with the diagnosis of RIGHT RENAL STONE. We discussed she has significant RLP stone burdern and we will start from top stone down but she may need a staged procedure.    Donnice Brooks

## 2024-06-16 ENCOUNTER — Encounter (HOSPITAL_COMMUNITY): Payer: Self-pay | Admitting: Urology
